# Patient Record
Sex: Female | Born: 1970 | Race: Black or African American | Hispanic: No | Smoking: Never smoker
Health system: Southern US, Community
[De-identification: ages and names within clinical notes are randomized; demographics above are authoritative.]

## PROBLEM LIST (undated history)

## (undated) DIAGNOSIS — E669 Obesity, unspecified: Secondary | ICD-10-CM

## (undated) DIAGNOSIS — G4730 Sleep apnea, unspecified: Secondary | ICD-10-CM

## (undated) DIAGNOSIS — I10 Essential (primary) hypertension: Secondary | ICD-10-CM

## (undated) HISTORY — PX: CHOLECYSTECTOMY: SHX55

## (undated) HISTORY — DX: Obesity, unspecified: E66.9

## (undated) HISTORY — PX: ENDOMETRIAL ABLATION: SHX621

## (undated) HISTORY — PX: BREAST BIOPSY: SHX20

## (undated) HISTORY — DX: Sleep apnea, unspecified: G47.30

## (undated) HISTORY — DX: Essential (primary) hypertension: I10

---

## 1998-08-02 ENCOUNTER — Other Ambulatory Visit: Admission: RE | Admit: 1998-08-02 | Discharge: 1998-08-02 | Payer: Self-pay | Admitting: *Deleted

## 1998-10-10 ENCOUNTER — Other Ambulatory Visit: Admission: RE | Admit: 1998-10-10 | Discharge: 1998-10-10 | Payer: Self-pay | Admitting: *Deleted

## 2001-08-27 ENCOUNTER — Emergency Department (HOSPITAL_COMMUNITY): Admission: EM | Admit: 2001-08-27 | Discharge: 2001-08-28 | Payer: Self-pay | Admitting: Emergency Medicine

## 2002-02-01 ENCOUNTER — Emergency Department (HOSPITAL_COMMUNITY): Admission: EM | Admit: 2002-02-01 | Discharge: 2002-02-01 | Payer: Self-pay | Admitting: Emergency Medicine

## 2002-02-01 ENCOUNTER — Encounter: Payer: Self-pay | Admitting: Emergency Medicine

## 2003-04-19 ENCOUNTER — Emergency Department (HOSPITAL_COMMUNITY): Admission: EM | Admit: 2003-04-19 | Discharge: 2003-04-19 | Payer: Self-pay | Admitting: Emergency Medicine

## 2003-05-19 ENCOUNTER — Emergency Department (HOSPITAL_COMMUNITY): Admission: EM | Admit: 2003-05-19 | Discharge: 2003-05-19 | Payer: Self-pay | Admitting: Emergency Medicine

## 2003-09-14 ENCOUNTER — Other Ambulatory Visit: Admission: RE | Admit: 2003-09-14 | Discharge: 2003-09-14 | Payer: Self-pay | Admitting: Obstetrics and Gynecology

## 2003-10-18 ENCOUNTER — Other Ambulatory Visit: Admission: RE | Admit: 2003-10-18 | Discharge: 2003-10-18 | Payer: Self-pay | Admitting: Obstetrics and Gynecology

## 2004-09-16 ENCOUNTER — Other Ambulatory Visit: Admission: RE | Admit: 2004-09-16 | Discharge: 2004-09-16 | Payer: Self-pay | Admitting: Obstetrics and Gynecology

## 2005-01-20 ENCOUNTER — Emergency Department (HOSPITAL_COMMUNITY): Admission: EM | Admit: 2005-01-20 | Discharge: 2005-01-20 | Payer: Self-pay | Admitting: Emergency Medicine

## 2005-03-07 ENCOUNTER — Emergency Department (HOSPITAL_COMMUNITY): Admission: EM | Admit: 2005-03-07 | Discharge: 2005-03-07 | Payer: Self-pay | Admitting: Family Medicine

## 2005-12-28 ENCOUNTER — Emergency Department (HOSPITAL_COMMUNITY): Admission: EM | Admit: 2005-12-28 | Discharge: 2005-12-28 | Payer: Self-pay | Admitting: Family Medicine

## 2006-01-29 ENCOUNTER — Encounter: Admission: RE | Admit: 2006-01-29 | Discharge: 2006-01-29 | Payer: Self-pay | Admitting: Cardiology

## 2006-05-05 ENCOUNTER — Other Ambulatory Visit: Admission: RE | Admit: 2006-05-05 | Discharge: 2006-05-05 | Payer: Self-pay | Admitting: Obstetrics and Gynecology

## 2006-06-26 ENCOUNTER — Emergency Department (HOSPITAL_COMMUNITY): Admission: EM | Admit: 2006-06-26 | Discharge: 2006-06-26 | Payer: Self-pay | Admitting: Emergency Medicine

## 2006-06-27 ENCOUNTER — Encounter: Payer: Self-pay | Admitting: Vascular Surgery

## 2006-06-27 ENCOUNTER — Ambulatory Visit (HOSPITAL_COMMUNITY): Admission: RE | Admit: 2006-06-27 | Discharge: 2006-06-27 | Payer: Self-pay | Admitting: Emergency Medicine

## 2006-07-13 ENCOUNTER — Encounter (INDEPENDENT_AMBULATORY_CARE_PROVIDER_SITE_OTHER): Payer: Self-pay | Admitting: *Deleted

## 2006-07-13 ENCOUNTER — Ambulatory Visit (HOSPITAL_COMMUNITY): Admission: RE | Admit: 2006-07-13 | Discharge: 2006-07-13 | Payer: Self-pay | Admitting: Obstetrics and Gynecology

## 2006-08-11 ENCOUNTER — Encounter: Admission: RE | Admit: 2006-08-11 | Discharge: 2006-08-11 | Payer: Self-pay | Admitting: Obstetrics and Gynecology

## 2007-04-14 ENCOUNTER — Emergency Department (HOSPITAL_COMMUNITY): Admission: EM | Admit: 2007-04-14 | Discharge: 2007-04-14 | Payer: Self-pay | Admitting: Emergency Medicine

## 2007-07-26 IMAGING — MG MM SCREEN MAMMOGRAM BILATERAL
4 series · 4 of 4 positions shown · non-contrast
Comparison: none

DG SCREEN MAMMOGRAM BILATERAL
Bilateral CC and MLO view(s) were taken.

DIGITAL SCREENING MAMMOGRAM WITH CAD:
There is a fibroglandular pattern.  No masses or malignant type calcifications are identified.

[R CC]
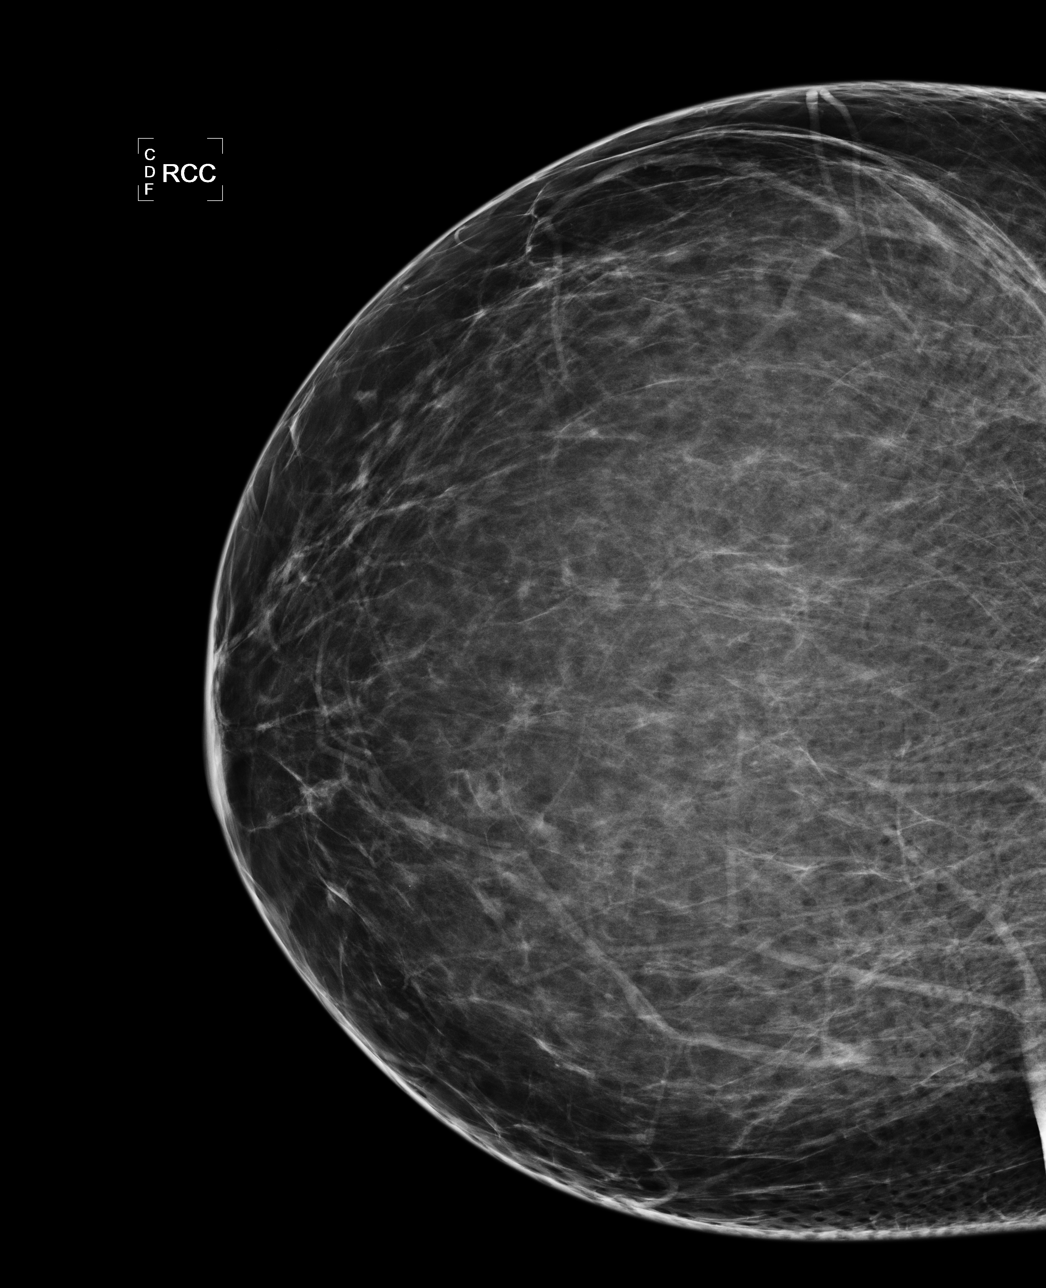

[L CC]
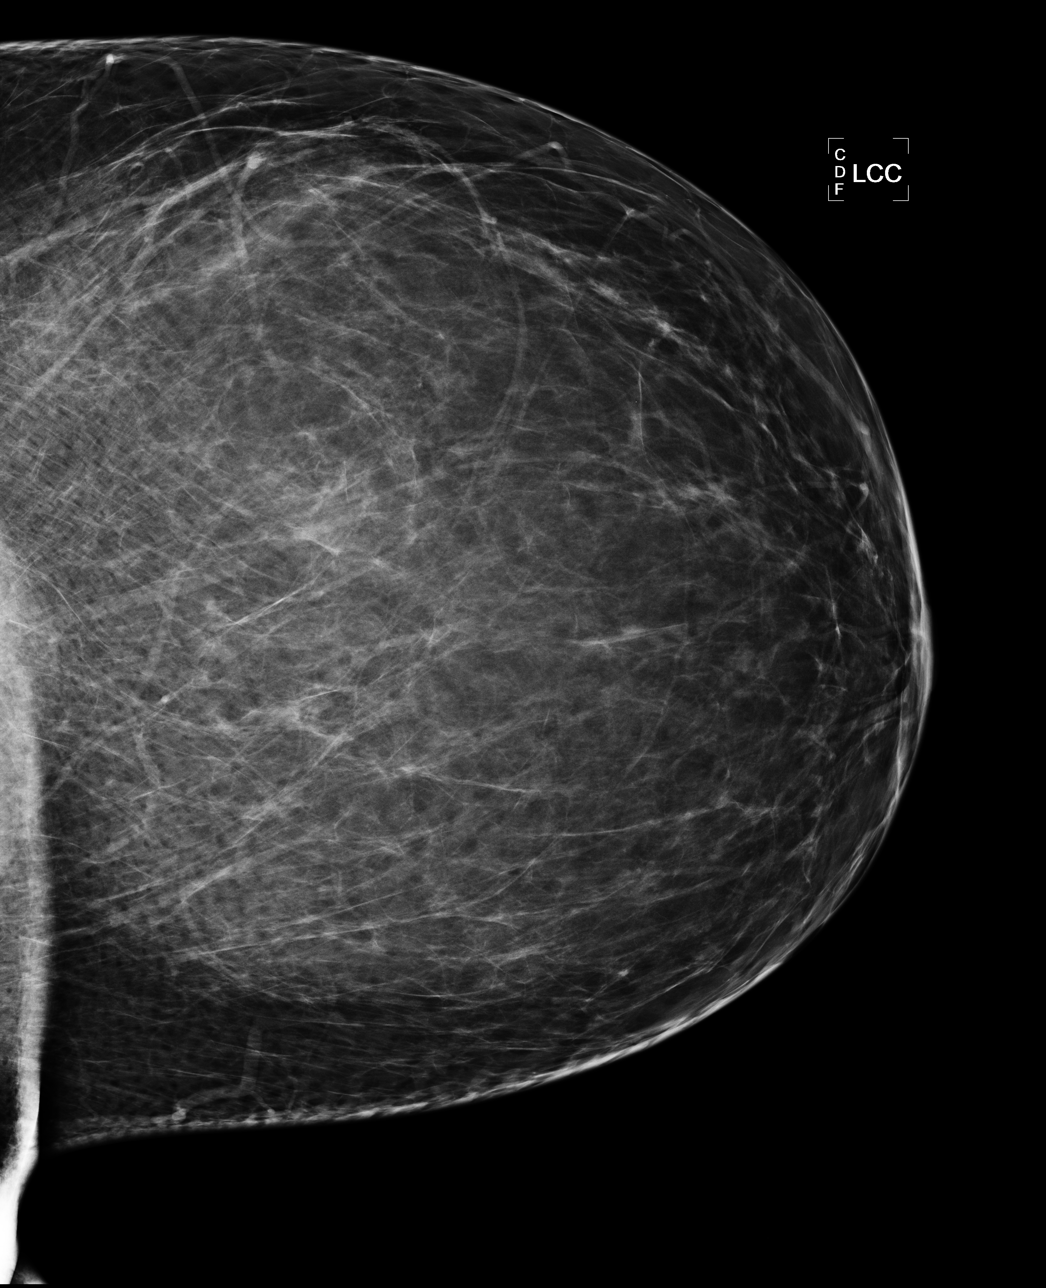

[L MLO]
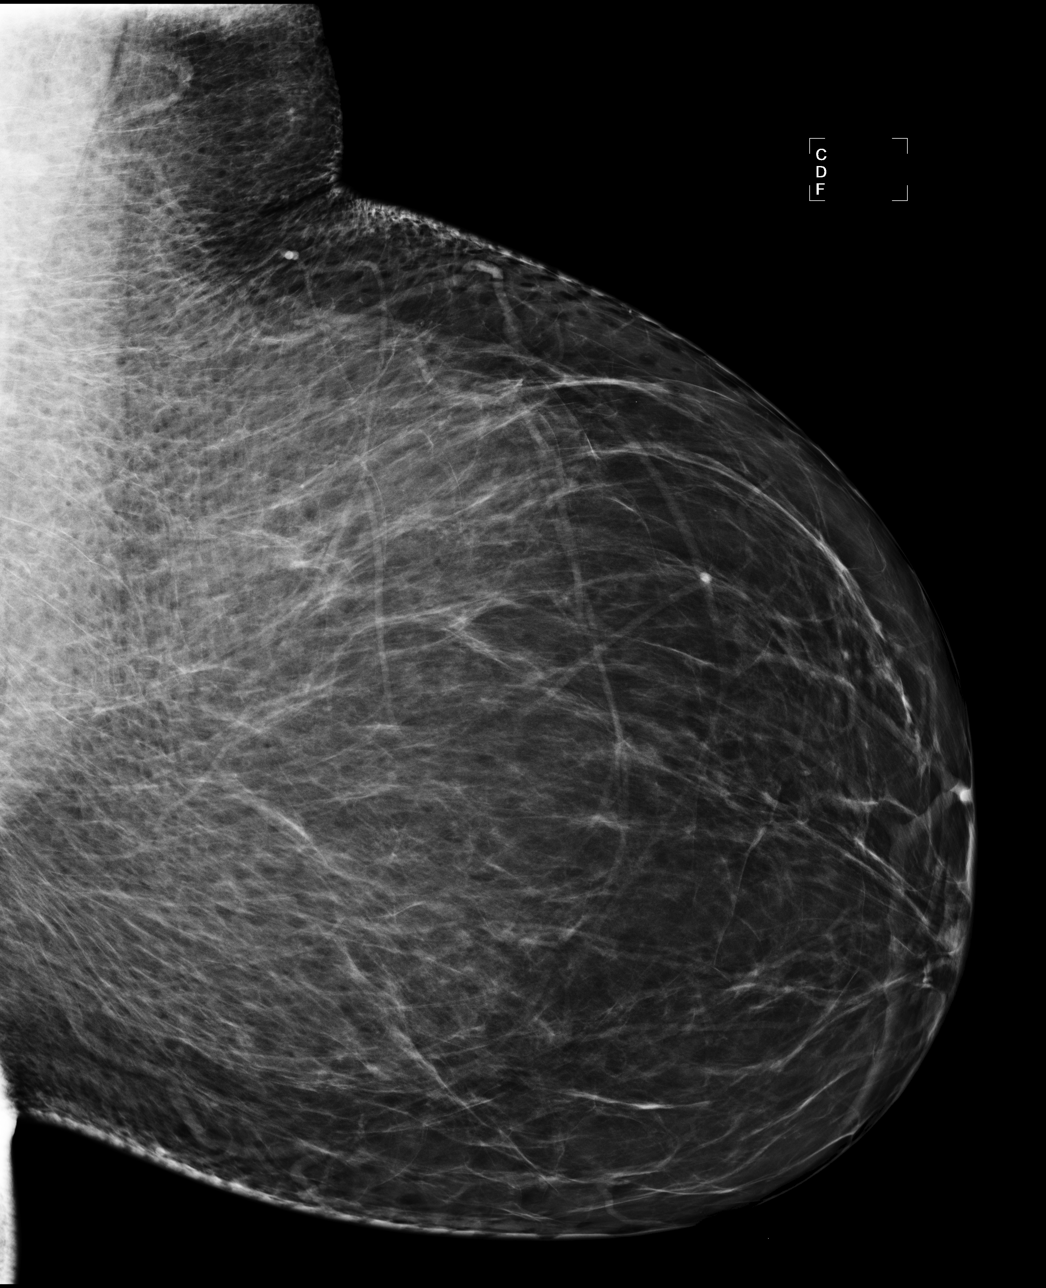

[R MLO]
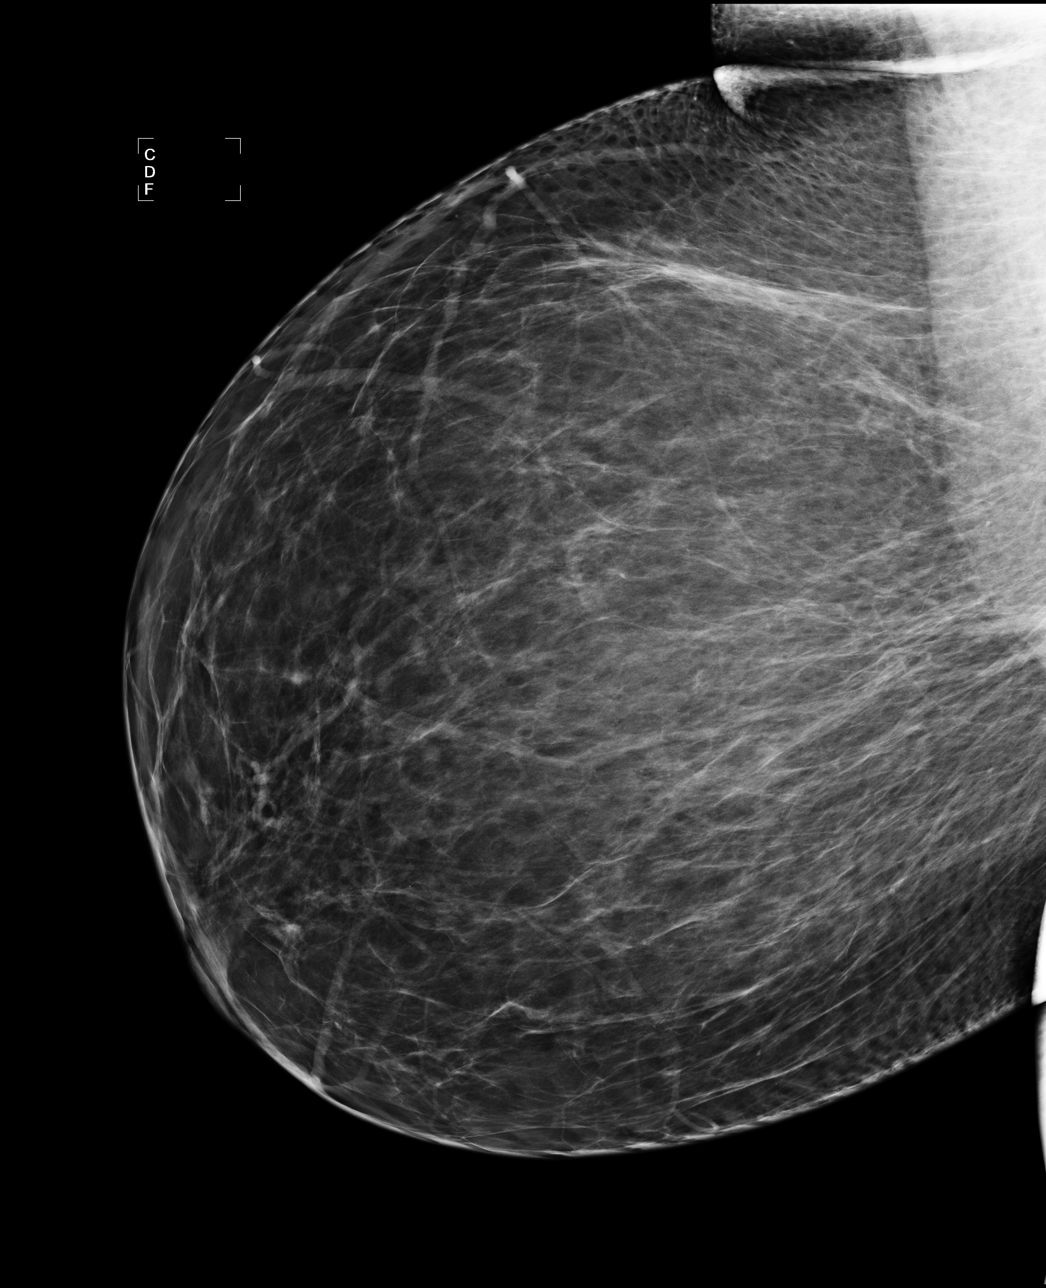

[4 of 4 positions shown; findings below may reference images not displayed]

IMPRESSION: No specific mammographic evidence of malignancy.  Next screening mammogram is recommended in one 
year. Consider screening MRI every 1-2 years given the patient's high risk for breast cancer.

ASSESSMENT: Negative - BI-RADS 1

Screening mammogram in 1 year.
ANALYZED BY COMPUTER AIDED DETECTION. , THIS PROCEDURE WAS A DIGITAL MAMMOGRAM.

## 2007-08-23 ENCOUNTER — Encounter: Payer: Self-pay | Admitting: Family Medicine

## 2007-09-23 ENCOUNTER — Encounter: Admission: RE | Admit: 2007-09-23 | Discharge: 2007-09-23 | Payer: Self-pay | Admitting: Obstetrics and Gynecology

## 2007-10-04 ENCOUNTER — Encounter: Payer: Self-pay | Admitting: Family Medicine

## 2008-10-07 ENCOUNTER — Emergency Department (HOSPITAL_COMMUNITY): Admission: EM | Admit: 2008-10-07 | Discharge: 2008-10-07 | Payer: Self-pay | Admitting: Family Medicine

## 2008-10-12 ENCOUNTER — Encounter: Admission: RE | Admit: 2008-10-12 | Discharge: 2008-10-12 | Payer: Self-pay | Admitting: Obstetrics and Gynecology

## 2009-08-07 ENCOUNTER — Encounter: Payer: Self-pay | Admitting: Family Medicine

## 2009-08-09 ENCOUNTER — Encounter (INDEPENDENT_AMBULATORY_CARE_PROVIDER_SITE_OTHER): Payer: Self-pay | Admitting: *Deleted

## 2009-08-22 ENCOUNTER — Ambulatory Visit: Payer: Self-pay | Admitting: Family Medicine

## 2009-08-22 DIAGNOSIS — I10 Essential (primary) hypertension: Secondary | ICD-10-CM | POA: Insufficient documentation

## 2009-08-22 DIAGNOSIS — G43009 Migraine without aura, not intractable, without status migrainosus: Secondary | ICD-10-CM | POA: Insufficient documentation

## 2009-08-29 ENCOUNTER — Telehealth: Payer: Self-pay | Admitting: Family Medicine

## 2009-09-13 ENCOUNTER — Ambulatory Visit: Payer: Self-pay | Admitting: Family Medicine

## 2009-11-08 ENCOUNTER — Emergency Department (HOSPITAL_COMMUNITY): Admission: EM | Admit: 2009-11-08 | Discharge: 2009-11-08 | Payer: Self-pay | Admitting: Emergency Medicine

## 2010-02-06 ENCOUNTER — Ambulatory Visit: Payer: Self-pay | Admitting: Family Medicine

## 2010-02-18 ENCOUNTER — Telehealth (INDEPENDENT_AMBULATORY_CARE_PROVIDER_SITE_OTHER): Payer: Self-pay | Admitting: *Deleted

## 2010-02-18 LAB — CONVERTED CEMR LAB
AST: 22 units/L (ref 0–37)
Albumin: 3.8 g/dL (ref 3.5–5.2)
CO2: 31 meq/L (ref 19–32)
Calcium: 9.3 mg/dL (ref 8.4–10.5)
Chloride: 104 meq/L (ref 96–112)
Creatinine, Ser: 0.8 mg/dL (ref 0.4–1.2)
Glucose, Bld: 93 mg/dL (ref 70–99)
HDL: 48.1 mg/dL (ref >39.00)
Hgb A1c MFr Bld: 6.2 % (ref 4.6–6.5)
LDL Cholesterol: 82 mg/dL (ref 0–99)
Sodium: 140 meq/L (ref 135–145)
Total Bilirubin: 0.7 mg/dL (ref 0.3–1.2)
Total CHOL/HDL Ratio: 3

## 2010-03-12 ENCOUNTER — Ambulatory Visit: Payer: Self-pay | Admitting: Family Medicine

## 2010-03-12 DIAGNOSIS — J309 Allergic rhinitis, unspecified: Secondary | ICD-10-CM | POA: Insufficient documentation

## 2010-03-12 DIAGNOSIS — E876 Hypokalemia: Secondary | ICD-10-CM | POA: Insufficient documentation

## 2010-04-12 ENCOUNTER — Telehealth: Payer: Self-pay | Admitting: Family Medicine

## 2010-05-03 ENCOUNTER — Telehealth (INDEPENDENT_AMBULATORY_CARE_PROVIDER_SITE_OTHER): Payer: Self-pay | Admitting: *Deleted

## 2010-05-10 ENCOUNTER — Encounter: Payer: Self-pay | Admitting: Family Medicine

## 2010-05-28 ENCOUNTER — Telehealth: Payer: Self-pay | Admitting: Internal Medicine

## 2010-06-10 ENCOUNTER — Ambulatory Visit: Payer: Self-pay | Admitting: Family Medicine

## 2010-06-13 ENCOUNTER — Telehealth: Payer: Self-pay | Admitting: Family Medicine

## 2010-07-09 ENCOUNTER — Ambulatory Visit: Payer: Self-pay | Admitting: Family Medicine

## 2010-07-09 DIAGNOSIS — G43909 Migraine, unspecified, not intractable, without status migrainosus: Secondary | ICD-10-CM | POA: Insufficient documentation

## 2010-07-29 ENCOUNTER — Ambulatory Visit: Payer: Self-pay | Admitting: Family Medicine

## 2010-07-31 LAB — CONVERTED CEMR LAB: Sed Rate: 15 mm/hr (ref 0–22)

## 2010-08-19 ENCOUNTER — Ambulatory Visit: Payer: Self-pay | Admitting: Family Medicine

## 2010-08-19 DIAGNOSIS — J069 Acute upper respiratory infection, unspecified: Secondary | ICD-10-CM | POA: Insufficient documentation

## 2010-10-07 ENCOUNTER — Telehealth: Payer: Self-pay | Admitting: Family Medicine

## 2010-11-18 ENCOUNTER — Ambulatory Visit: Payer: Self-pay | Admitting: Family Medicine

## 2010-11-18 DIAGNOSIS — R7309 Other abnormal glucose: Secondary | ICD-10-CM | POA: Insufficient documentation

## 2010-11-18 HISTORY — DX: Other abnormal glucose: R73.09

## 2010-11-26 LAB — CONVERTED CEMR LAB
CO2: 25 meq/L (ref 19–32)
Calcium: 9.1 mg/dL (ref 8.4–10.5)
Chloride: 105 meq/L (ref 96–112)
Glucose, Bld: 88 mg/dL (ref 70–99)
Potassium: 4 meq/L (ref 3.5–5.1)
Sodium: 137 meq/L (ref 135–145)

## 2010-11-27 ENCOUNTER — Encounter: Payer: Self-pay | Admitting: Family Medicine

## 2010-12-21 ENCOUNTER — Encounter: Payer: Self-pay | Admitting: Obstetrics and Gynecology

## 2010-12-22 ENCOUNTER — Encounter: Payer: Self-pay | Admitting: Neurology

## 2010-12-29 LAB — CONVERTED CEMR LAB
ALT: 18 units/L (ref 0–35)
AST: 22 units/L (ref 0–37)
BUN: 9 mg/dL (ref 6–23)
Basophils Absolute: 0 10*3/uL (ref 0.0–0.1)
Bilirubin, Direct: 0 mg/dL (ref 0.0–0.3)
CO2: 32 meq/L (ref 19–32)
Calcium: 9.4 mg/dL (ref 8.4–10.5)
Chloride: 103 meq/L (ref 96–112)
Cholesterol: 156 mg/dL (ref 0–200)
Creatinine, Ser: 0.8 mg/dL (ref 0.4–1.2)
Creatinine, Ser: 1 mg/dL (ref 0.4–1.2)
Eosinophils Relative: 5.1 % — ABNORMAL HIGH (ref 0.0–5.0)
GFR calc non Af Amer: 80.01 mL/min (ref >60)
HDL: 35.5 mg/dL — ABNORMAL LOW (ref >39.00)
LDL Cholesterol: 109 mg/dL — ABNORMAL HIGH (ref 0–99)
Monocytes Relative: 11.6 % (ref 3.0–12.0)
Neutrophils Relative %: 31.4 % — ABNORMAL LOW (ref 43.0–77.0)
Platelets: 257 10*3/uL (ref 150.0–400.0)
Potassium: 4.1 meq/L (ref 3.5–5.1)
Total Bilirubin: 0.9 mg/dL (ref 0.3–1.2)
Triglycerides: 60 mg/dL (ref 0.0–149.0)
VLDL: 12 mg/dL (ref 0.0–40.0)
WBC: 5.9 10*3/uL (ref 4.5–10.5)

## 2010-12-31 NOTE — Assessment & Plan Note (Signed)
Summary: roa/cbs   Vital Signs:  Patient profile:   40 year old female Weight:      250 pounds Temp:     98.5 degrees F oral Pulse rate:   60 / minute Pulse rhythm:   regular BP sitting:   120 / 92  (right arm)  Vitals Entered By: Almeta Monas CMA Duncan Dull) (July 29, 2010 3:27 PM)  Serial Vital Signs/Assessments:  Time      Position  BP       Pulse  Resp  Temp     By 3:41 PM             140/98                         Loreen Freud DO  CC: f/u still having headachdes   History of Present Illness:  Hypertension follow-up      This is a 40 year old woman who presents for Hypertension follow-up.  The patient complains of headaches, but denies lightheadedness, urinary frequency, edema, impotence, rash, and fatigue.  The patient denies the following associated symptoms: chest pain, chest pressure, exercise intolerance, dyspnea, palpitations, syncope, leg edema, and pedal edema.  Compliance with medications (by patient report) has been near 100%.  The patient reports that dietary compliance has been good.  Adjunctive measures currently used by the patient include salt restriction.    Current Medications (verified): 1)  Diovan 80 Mg Tabs (Valsartan) .Marland Kitchen.. 1 By Mouth Once Daily 2)  Nasonex 50 Mcg/act Susp (Mometasone Furoate) 3)  Imitrex 50 Mg Tabs (Sumatriptan Succinate) .... As Directed 4)  Topamax 100 Mg Tabs (Topiramate) .Marland Kitchen.. 1 By Mouth Two Times A Day  Allergies (verified): No Known Drug Allergies  Physical Exam  General:  Well-developed,well-nourished,in no acute distress; alert,appropriate and cooperative throughout examination Eyes:  pupils equal, pupils round, pupils reactive to light, and no injection.   Lungs:  Normal respiratory effort, chest expands symmetrically. Lungs are clear to auscultation, no crackles or wheezes. Heart:  normal rate and no murmur.   Extremities:  No clubbing, cyanosis, edema, or deformity noted with normal full range of motion of all joints.     Neurologic:  alert & oriented X3, cranial nerves II-XII intact, strength normal in all extremities, and gait normal.   Skin:  Intact without suspicious lesions or rashes Psych:  Cognition and judgment appear intact. Alert and cooperative with normal attention span and concentration. No apparent delusions, illusions, hallucinations   Impression & Recommendations:  Problem # 1:  HYPERTENSION (ICD-401.9)  Her updated medication list for this problem includes:    Diovan 160 Mg Tabs (Valsartan) .Marland Kitchen... 1 by mouth once daily  BP today: 120/92 Prior BP: 130/100 (07/09/2010)  Labs Reviewed: K+: 4.1 (03/12/2010) Creat: : 0.8 (03/12/2010)   Chol: 142 (02/06/2010)   HDL: 48.10 (02/06/2010)   LDL: 82 (02/06/2010)   TG: 61.0 (02/06/2010)  Problem # 2:  MIGRAINE HEADACHE (ICD-346.90) Assessment: Unchanged  Her updated medication list for this problem includes:    Imitrex 50 Mg Tabs (Sumatriptan succinate) .Marland Kitchen... As directed    Vicodin Es 7.5-750 Mg Tabs (Hydrocodone-acetaminophen) .Marland Kitchen... 1 by mouth q6h as needed  Orders: Venipuncture (04540) TLB-Sedimentation Rate (ESR) (85652-ESR)  Complete Medication List: 1)  Diovan 160 Mg Tabs (Valsartan) .Marland Kitchen.. 1 by mouth once daily 2)  Nasonex 50 Mcg/act Susp (Mometasone furoate) 3)  Imitrex 50 Mg Tabs (Sumatriptan succinate) .... As directed 4)  Topamax 100 Mg Tabs (Topiramate) .Marland KitchenMarland KitchenMarland Kitchen  1 by mouth two times a day 5)  Vicodin Es 7.5-750 Mg Tabs (Hydrocodone-acetaminophen) .Marland Kitchen.. 1 by mouth q6h as needed  Patient Instructions: 1)  Please schedule a follow-up appointment in 2 weeks.  Prescriptions: VICODIN ES 7.5-750 MG TABS (HYDROCODONE-ACETAMINOPHEN) 1 by mouth q6h as needed  #10 x 0   Entered and Authorized by:   Loreen Freud DO   Signed by:   Loreen Freud DO on 07/29/2010   Method used:   Print then Give to Patient   RxID:   [id number]

## 2010-12-31 NOTE — Assessment & Plan Note (Signed)
Summary: F/U on med/scm   Vital Signs:  Patient profile:   40 year old female Height:      63 inches Weight:      250 pounds BMI:     44.45 Temp:     98.6 degrees F oral Pulse rate:   95 / minute BP sitting:   86 / 58  (left arm)  Vitals Entered By: Jeremy Johann CMA (June 10, 2010 4:20 PM) CC: f/u med Comments REVIEWED MED LIST, PATIENT AGREED DOSE AND INSTRUCTION CORRECT    History of Present Illness: Pt here to f/u weight.  Pt is walking everyday.   She is doing weight watchers.  She has not been in since April.  No complaints.  Current Medications (verified): 1)  Phentermine Hcl 37.5 Mg Caps (Phentermine Hcl) .Marland Kitchen.. 1 By Mouth Once Daily. 2)  Diovan 40 Mg Tabs (Valsartan) .Marland Kitchen.. 1 By Mouth Once Daily 3)  Klor-Con M20 20 Meq Cr-Tabs (Potassium Chloride Crys Cr) .Marland Kitchen.. 1 By Mouth Daily. 4)  Xyzal 5 Mg Tabs (Levocetirizine Dihydrochloride) .Marland Kitchen.. 1 By Mouth Once Daily 5)  Nasonex 50 Mcg/act Susp (Mometasone Furoate) 6)  Astepro 0.15 % Soln (Azelastine Hcl)  Allergies (verified): No Known Drug Allergies  Past History:  Past medical, surgical, family and social histories (including risk factors) reviewed for relevance to current acute and chronic problems.  Past Medical History: Reviewed history from 03/12/2010 and no changes required. migraines.  Hypertension Current Problems:  MORBID OBESITY (ICD-278.01) COMMON MIGRAINE (ICD-346.10) HYPERTENSION (ICD-401.9) FAMILY HISTORY BREAST CANCER 1ST DEGREE RELATIVE <50 (ICD-V16.3)  Past Surgical History: Reviewed history from 08/22/2009 and no changes required. Caesarean section Cholecystectomy  Family History: Reviewed history from 08/22/2009 and no changes required. Family History Breast cancer 1st degree relative <50 Family History Hypertension Family History Kidney disease--- B---dialysis-- passed away 39 yo  Social History: Reviewed history from 08/22/2009 and no changes required. 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 Lungs:  Normal respiratory effort, chest expands symmetrically. Lungs are clear to auscultation, no crackles or wheezes. Heart:  Normal rate and regular rhythm. S1 and S2 normal without gallop, murmur, click, rub or other extra sounds. Extremities:  No clubbing, cyanosis, edema, or deformity noted with normal full range of motion of all joints.   Skin:  Intact without suspicious lesions or rashes Psych:  Cognition and judgment appear intact. Alert and cooperative with normal attention span and concentration. No apparent delusions, illusions, hallucinations   Impression & Recommendations:  Problem # 1:  MORBID OBESITY (ICD-278.01) con't diet and exercise adipex 37.51  1 by mouth once daily  Ht: 63 (06/10/2010)   Wt: 250 (06/10/2010)   BMI: 44.45 (06/10/2010)  Problem # 2:  HYPERTENSION (ICD-401.9)  low today--decrease diovan recheck 1 month Her updated medication list for this problem includes:    Diovan 40 Mg Tabs (Valsartan) .Marland Kitchen... 1 by mouth once daily  BP today: 86/58 Prior BP: 118/82 (03/12/2010)  Labs Reviewed: K+: 4.1 (03/12/2010) Creat: : 0.8 (03/12/2010)   Chol: 142 (02/06/2010)   HDL: 48.10 (02/06/2010)   LDL: 82 (02/06/2010)   TG: 61.0 (02/06/2010)  Complete Medication List: 1)  Phentermine Hcl 37.5 Mg Caps (Phentermine hcl) .Marland Kitchen.. 1 by mouth once daily. 2)  Diovan 40 Mg Tabs (Valsartan) .Marland Kitchen.. 1 by mouth once daily  3)  Klor-con M20 20 Meq Cr-tabs (Potassium chloride crys cr) .Marland Kitchen.. 1 by mouth daily. 4)  Xyzal 5 Mg Tabs (Levocetirizine dihydrochloride) .Marland Kitchen.. 1 by mouth once daily 5)  Nasonex 50 Mcg/act Susp (Mometasone furoate) 6)  Astepro 0.15 % Soln (Azelastine hcl)  Patient Instructions: 1)  Please schedule a  follow-up appointment in 1 month.  Prescriptions: DIOVAN 40 MG TABS (VALSARTAN) 1 by mouth once daily  #90 x 3   Entered and Authorized by:   Loreen Freud DO   Signed by:   Loreen Freud DO on 06/10/2010   Method used:   Print then Give to Patient   RxID:   [id number] PHENTERMINE HCL 37.5 MG CAPS (PHENTERMINE HCL) 1 by mouth once daily.  #30 x 0   Entered and Authorized by:   Loreen Freud DO   Signed by:   Loreen Freud DO on 06/10/2010   Method used:   Print then Give to Patient   RxID:   [id number]

## 2010-12-31 NOTE — Progress Notes (Signed)
Summary: Prior Auth- approved   Phone Note Refill Request Call back at [phone] Message from:  Pharmacy on May 03, 2010 8:43 AM  Refills Requested: Medication #1:  XYZAL 5 MG TABS 1 by mouth once daily   Dosage confirmed as above?Dosage Confirmed   Supply Requested: 1 month [facility] on [facility] Prior Berkley Harvey [phone]  Next Appointment Scheduled: none Initial call taken by: Harold Barban,  May 03, 2010 8:43 AM  Follow-up for Phone Call        Maura Crandall started- case # [id number]  Additional Follow-up for Phone Call Additional follow up Details #1::        Callled pt to see if she has every tried any OTC, claritin, allegra, zyrtec. Unable to leave voicemail. Army Fossa CMA  May 03, 2010 2:58 PM Left msg for pt to call .Kandice Hams  May 07, 2010 8:42 AM LEFT 3RD MSG FOR PT.Kandice Hams  May 08, 2010 2:37 PM PT HAS TRIED ALLEGRA AND ZYRTEC per 03/12/10 office note .Kandice Hams  May 10, 2010 10:34 AM     Additional Follow-up by: Kandice Hams,  May 10, 2010 10:34 AM    Additional Follow-up for Phone Call Additional follow up Details #2::    xyzal has been approved from 04/19/10- 05/10/11 information faxed to pharmacy.......Marland KitchenDoristine Devoid  May 13, 2010 9:46 AM

## 2010-12-31 NOTE — Assessment & Plan Note (Signed)
Summary: roa per pt//lch   Vital Signs:  Patient profile:   40 year old female Weight:      255 pounds Pulse rate:   68 / minute Pulse rhythm:   regular BP sitting:   118 / 82  (left arm) Cuff size:   regular  Vitals Entered By: Army Fossa CMA (March 12, 2010 10:27 AM) CC: Pt here for refill on Phentermine, discuss allergies, URI symptoms   History of Present Illness:       This is a 40 year old woman who presents with URI symptoms.  The patient complains of clear nasal discharge, but denies nasal congestion, purulent nasal discharge, sore throat, dry cough, productive cough, earache, and sick contacts.  The patient denies fever, low-grade fever (<100.5 degrees), fever of 100.5-103 degrees, fever of 103.1-104 degrees, fever to >104 degrees, stiff neck, dyspnea, wheezing, rash, vomiting, diarrhea, use of an antipyretic, and response to antipyretic.  The patient also reports itchy watery eyes, sneezing, and seasonal symptoms.  The patient denies response to antihistamine, headache, muscle aches, and severe fatigue.  The patient denies the following risk factors for Strep sinusitis: unilateral facial pain, unilateral nasal discharge, poor response to decongestant, double sickening, tooth pain, Strep exposure, tender adenopathy, and absence of cough.  Pt tried zyrtec and allegra with no relief.    Pt also here for refill phenteramine.  Pt using [facility] on the [place].  Pt is doing weight watchers online.  Pt eating mostly fruit.    Allergies (verified): No Known Drug Allergies  Past History:  Past Medical History: migraines.  Hypertension Current Problems:  MORBID OBESITY (ICD-278.01) COMMON MIGRAINE (ICD-346.10) HYPERTENSION (ICD-401.9) FAMILY HISTORY BREAST CANCER 1ST DEGREE RELATIVE <50 (ICD-V16.3)  Physical Exam  General:  Well-developed,well-nourished,in no acute distress; alert,appropriate and cooperative throughout examination Ears:  External ear exam shows no  significant lesions or deformities.  Otoscopic examination reveals clear canals, tympanic membranes are intact bilaterally without bulging, retraction, inflammation or discharge. Hearing is grossly normal bilaterally. Nose:  no external deformity, mucosal erythema, and mucosal edema.   Mouth:  Oral mucosa and oropharynx without lesions or exudates.  Teeth in good repair. Neck:  No deformities, masses, or tenderness noted. Lungs:  Normal respiratory effort, chest expands symmetrically. Lungs are clear to auscultation, no crackles or wheezes. Heart:  normal rate and no murmur.   Extremities:  No clubbing, cyanosis, edema, or deformity noted with normal full range of motion of all joints.   Skin:  Intact without suspicious lesions or rashes Cervical Nodes:  No lymphadenopathy noted Psych:  Oriented X3 and normally interactive.     Impression & Recommendations:  Problem # 1:  MORBID OBESITY (ICD-278.01)  d/w importance of diet and exerices refill phenteramine rto 1 month Ht: 63 (08/22/2009)   Wt: 255 (03/12/2010)   BMI: 45.51 (08/22/2009)  Orders: EKG w/ Interpretation (93000)  Problem # 2:  ALLERGIC RHINITIS (ICD-477.9)  Her updated medication list for this problem includes:    Xyzal 5 Mg Tabs (Levocetirizine dihydrochloride) .Marland Kitchen... 1 by mouth once daily    Nasonex 50 Mcg/act Susp (Mometasone furoate)    Astepro 0.15 % Soln (Azelastine hcl)  Discussed use of allergy medications and environmental measures.   Problem # 3:  HYPOKALEMIA (ICD-276.8)  Orders: Venipuncture (16109) TLB-BMP (Basic Metabolic Panel-BMET) (80048-METABOL)  Complete Medication List: 1)  Phentermine Hcl 37.5 Mg Caps (Phentermine hcl) .Marland Kitchen.. 1 by mouth once daily. 2)  Diovan Hct 80-12.5 Mg Tabs (Valsartan-hydrochlorothiazide) .Marland Kitchen.. 1 by mouth once  daily 3)  Klor-con M20 20 Meq Cr-tabs (Potassium chloride crys cr) .Marland Kitchen.. 1 by mouth daily. 4)  Xyzal 5 Mg Tabs (Levocetirizine dihydrochloride) .Marland Kitchen.. 1 by mouth once  daily 5)  Nasonex 50 Mcg/act Susp (Mometasone furoate) 6)  Astepro 0.15 % Soln (Azelastine hcl) Prescriptions: XYZAL 5 MG TABS (LEVOCETIRIZINE DIHYDROCHLORIDE) 1 by mouth once daily  #30 x 5   Entered and Authorized by:   Loreen Freud DO   Signed by:   Loreen Freud DO on 03/12/2010   Method used:   Print then Give to Patient   RxID:   [id number] PHENTERMINE HCL 37.5 MG CAPS (PHENTERMINE HCL) 1 by mouth once daily.  #30 x 0   Entered and Authorized by:   Loreen Freud DO   Signed by:   Loreen Freud DO on 03/12/2010   Method used:   Print then Give to Patient   RxID:   [id number]    EKG  Procedure date:  03/12/2010  Findings:      Normal sinus rhythm with rate of:  68

## 2010-12-31 NOTE — Progress Notes (Signed)
Summary: Phentermine  Phone Note Call from Patient Call back at Work Phone [phone]   Summary of Call: Patient picked up phentermine yesterday and it was caps not tabs. Patient was inquiring as to why it was changed. Please advise. Initial call taken by: Lucious Groves CMA,  June 13, 2010 11:32 AM  Follow-up for Phone Call        somehow it was changed on med list---they are the same --we can change it back for next time if she prefers.  Follow-up by: Loreen Freud DO,  June 13, 2010 11:43 AM  Additional Follow-up for Phone Call Additional follow up Details #1::        pt aware and prefers tabs instead. med changed back.............[name] CMA  June 13, 2010 11:54 AM     New/Updated Medications: PHENTERMINE HCL 37.5 MG TABS (PHENTERMINE HCL) 1 tab once daily

## 2010-12-31 NOTE — Progress Notes (Signed)
Summary: Triage: Refill Request/ Med Change Request  Phone Note Call from Patient Call back at [phone]   Summary of Call: Message left on triage VM: patient would like a refill on phentermine and would like B/P med changed b/c current med is too expensive  I spoke with patient and informed her to refill Phentermine she needs OV (scheduled for 06/10/2010 @ 4:00pm), patient then mentiond she would like a B/P med that has a generic (on 4 dollar [employer]). Patient would like new med sent to [facility] ([place] Patient aware Dr.Lowne is out of the office and another Dr. will advise  Please advise Shonna Chock  May 28, 2010 10:19 AM   Follow-up for Phone Call        i have 3 weeks of samples available for pt to take.  she can discuss changing meds w/ her regular Dr when she returns for her appt. Follow-up by: Neena Rhymes MD,  May 28, 2010 10:41 AM  Additional Follow-up for Phone Call Additional follow up Details #1::        Patient aware and ok'd Additional Follow-up by: Shonna Chock,  May 28, 2010 11:09 AM

## 2010-12-31 NOTE — Progress Notes (Signed)
Summary: med too expensive  Phone Note Call from Patient Call back at Work Phone [phone]   Caller: Patient Summary of Call: Pt left VM that diovan is too expensive now that her discount card has expired. Pt would like a cheaper alternative.......................[name] CMA  Apr 12, 2010 9:47 AM   pt currently out of med. pt given samples of med until alternative med can be rx..............[name] CMA  Apr 12, 2010 9:49 AM   Follow-up for Phone Call        There is a new discount cared she can have or switch to hyzar 50/12.5 #30  1 by mouth once daily -----ov 2-3 weeks Follow-up by: Loreen Freud DO,  Apr 13, 2010 6:52 AM  Additional Follow-up for Phone Call Additional follow up Details #1::        Pt is going to use the new Discount card, request that it is mailed to her. Army Fossa CMA  Apr 15, 2010 8:41 AM

## 2010-12-31 NOTE — Assessment & Plan Note (Signed)
Summary: rto 3 weeks/cbs   Vital Signs:  Patient profile:   40 year old female Weight:      247.2 pounds Temp:     98.5 degrees F oral Pulse rate:   84 / minute Pulse rhythm:   regular BP sitting:   118 / 86  (right arm)  Vitals Entered By: Almeta Monas CMA Duncan Dull) (August 19, 2010 3:24 PM) CC: 3 WK F/U C/O flu like symptoms since starting new dose of Diovan , URI symptoms   History of Present Illness:  Hypertension follow-up      This is a 40 year old woman who presents for Hypertension follow-up.  The patient denies lightheadedness, urinary frequency, headaches, edema, impotence, rash, and fatigue.  The patient denies the following associated symptoms: chest pain, chest pressure, exercise intolerance, dyspnea, palpitations, syncope, leg edema, and pedal edema.  Compliance with medications (by patient report) has been near 100%.  The patient reports that dietary compliance has been good.  Adjunctive measures currently used by the patient include salt restriction.         The patient also presents with URI symptoms.  The patient complains of nasal congestion and dry cough.  The patient denies fever, low-grade fever (<100.5 degrees), fever of 100.5-103 degrees, fever of 103.1-104 degrees, fever to >104 degrees, stiff neck, dyspnea, wheezing, rash, vomiting, diarrhea, use of an antipyretic, and response to antipyretic.  The patient also reports sneezing.  The patient denies the following risk factors for Strep sinusitis: unilateral facial pain, unilateral nasal discharge, poor response to decongestant, double sickening, tooth pain, Strep exposure, tender adenopathy, and absence of cough.    Current Medications (verified): 1)  Diovan 160 Mg Tabs (Valsartan) .Marland Kitchen.. 1 By Mouth Once Daily 2)  Nasonex 50 Mcg/act Susp (Mometasone Furoate) 3)  Imitrex 50 Mg Tabs (Sumatriptan Succinate) .... As Directed 4)  Topamax 100 Mg Tabs (Topiramate) .Marland Kitchen.. 1 By Mouth Two Times A Day 5)  Vicodin Es 7.5-750 Mg  Tabs (Hydrocodone-Acetaminophen) .Marland Kitchen.. 1 By Mouth Q6h As Needed  Allergies (verified): No Known Drug Allergies  Past History:  Past medical, surgical, family and social histories (including risk factors) reviewed for relevance to current acute and chronic problems.  Past Medical History: Reviewed history from 03/12/2010 and no changes required. migraines.  Hypertension Current Problems:  MORBID OBESITY (ICD-278.01) COMMON MIGRAINE (ICD-346.10) HYPERTENSION (ICD-401.9) FAMILY HISTORY BREAST CANCER 1ST DEGREE RELATIVE <50 (ICD-V16.3)  Past Surgical History: Reviewed history from 08/22/2009 and no changes required. Caesarean section Cholecystectomy  Family History: Reviewed history from 08/22/2009 and no changes required. Family History Breast cancer 1st degree relative <50 Family History Hypertension Family History Kidney disease--- B---dialysis-- passed away 39 yo  Social History: Reviewed history from 08/22/2009 and no changes required. 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 Lungs are clear to auscultation, no crackles or wheezes. Heart:  Normal rate and regular rhythm. S1 and S2 normal without gallop, murmur, click, rub or other extra sounds. Extremities:  No clubbing, cyanosis, edema, or deformity noted with normal full range of motion of all joints.   Psych:  Cognition and judgment appear intact. Alert and cooperative with normal attention span and concentration. No apparent delusions, illusions, hallucinations   Impression & Recommendations:  Problem # 1:  HYPERTENSION (ICD-401.9)  Her updated medication list  for this problem includes:    Diovan 160 Mg Tabs (Valsartan) .Marland Kitchen... 1 by mouth once daily  BP  today: 118/86 Prior BP: 120/92 (07/29/2010)  Labs Reviewed: K+: 4.1 (03/12/2010) Creat: : 0.8 (03/12/2010)   Chol: 142 (02/06/2010)   HDL: 48.10 (02/06/2010)   LDL: 82 (02/06/2010)   TG: 61.0 (02/06/2010)  Problem # 2:  URI (ICD-465.9)  Her updated medication list for this problem includes:    Claritin 10 Mg Tabs (Loratadine) .Marland Kitchen... 1 by mouth once daily flonase  rto as needed   Instructed on symptomatic treatment. Call if symptoms persist or worsen.   Complete Medication List: 1)  Diovan 160 Mg Tabs (Valsartan) .Marland Kitchen.. 1 by mouth once daily 2)  Nasonex 50 Mcg/act Susp (Mometasone furoate) 3)  Imitrex 50 Mg Tabs (Sumatriptan succinate) .... As directed 4)  Topamax 100 Mg Tabs (Topiramate) .Marland Kitchen.. 1 by mouth two times a day 5)  Vicodin Es 7.5-750 Mg Tabs (Hydrocodone-acetaminophen) .Marland Kitchen.. 1 by mouth q6h as needed 6)  Flonase 50 Mcg/act Susp (Fluticasone propionate) .... 2 sprays each nostril once daily 7)  Claritin 10 Mg Tabs (Loratadine) .Marland Kitchen.. 1 by mouth once daily  Patient Instructions: 1)  Please schedule a follow-up appointment in 3 months .  Prescriptions: DIOVAN 160 MG TABS (VALSARTAN) 1 by mouth once daily  #90 x 3   Entered and Authorized by:   Loreen Freud DO   Signed by:   Loreen Freud DO on 08/19/2010   Method used:   Electronically to        [facility] [phone]* (retail)       [address]       [place], [place]  [address]       Ph: [phone]       Fax: [phone]   RxID:   [id number] FLONASE 50 MCG/ACT SUSP (FLUTICASONE PROPIONATE) 2 sprays each nostril once daily  #1 x 1   Entered and Authorized by:   Loreen Freud DO   Signed by:   Loreen Freud DO on 08/19/2010   Method used:   Electronically to        [facility] [phone]* (retail)       [address]       [place], [place]  [address]       Ph: [phone]       Fax: [phone]   RxID:   [id number]

## 2010-12-31 NOTE — Progress Notes (Signed)
Summary: med reaction  Phone Note Refill Request Call back at [phone]   Refills Requested: Medication #1:  DIOVAN 160 MG TABS 1 by mouth once daily Pt states that BP med is causing nausea, vomiting, and hair loss. Pt would like to have med change to something else. Pt uses [facility] [address]..................[name] CMA  October 07, 2010 8:10 AM    Follow-up for Phone Call        norvasc 5 mg #30  1 by mouth once daily ----ov 2-3 weeks Follow-up by: Loreen Freud DO,  October 07, 2010 10:18 AM  Additional Follow-up for Phone Call Additional follow up Details #1::        left pt detail message rx sent to pharmacy and to call for OV in 2-3 weeks....Marland KitchenMarland KitchenFelecia Deloach CMA  October 07, 2010 12:28 PM     New/Updated Medications: NORVASC 5 MG TABS (AMLODIPINE BESYLATE) Take 1 by mouth once daily**OV in 2-3 weeks** Prescriptions: NORVASC 5 MG TABS (AMLODIPINE BESYLATE) Take 1 by mouth once daily**OV in 2-3 weeks**  #30 x 0   Entered by:   Jeremy Johann CMA   Authorized by:   Loreen Freud DO   Signed by:   Jeremy Johann CMA on 10/07/2010   Method used:   Faxed to ...       [facility] [address] [phone]* (retail)       [address]       [place], [place]  [address]       Ph: [phone]       Fax: [phone]   RxID:   [id number]

## 2010-12-31 NOTE — Medication Information (Signed)
Summary: Prior Authorization & Approval for Xyzal/[employer]  Prior Authorization & Approval for Xyzal/[employer]   Imported By: Lanelle Bal 05/18/2010 08:22:22  _____________________________________________________________________  External Attachment:    Type:   Image     Comment:   External Document

## 2010-12-31 NOTE — Assessment & Plan Note (Signed)
Summary: f/u med/cb   Vital Signs:  Patient profile:   40 year old female Height:      63 inches Weight:      251 pounds BMI:     44.62 Temp:     98.2 degrees F oral Pulse rate:   76 / minute BP sitting:   130 / 100  (left arm)  Vitals Entered By: Jeremy Johann CMA (July 09, 2010 3:24 PM) CC: F/U MED, REFILL   History of Present Illness:  Hypertension follow-up      This is a 40 year old woman who presents for Hypertension follow-up.  The patient complains of headaches, but denies lightheadedness, urinary frequency, edema, impotence, rash, and fatigue.  The patient denies the following associated symptoms: chest pain, chest pressure, exercise intolerance, dyspnea, palpitations, syncope, leg edema, and pedal edema.  Compliance with medications (by patient report) has been near 100%.  The patient reports that dietary compliance has been good.  The patient reports no exercise.  Adjunctive measures currently used by the patient include salt restriction.    Current Medications (verified): 1)  Diovan 80 Mg Tabs (Valsartan) .Marland Kitchen.. 1 By Mouth Once Daily 2)  Nasonex 50 Mcg/act Susp (Mometasone Furoate) 3)  Imitrex 50 Mg Tabs (Sumatriptan Succinate) .... As Directed 4)  Topamax 100 Mg Tabs (Topiramate) .Marland Kitchen.. 1 By Mouth Two Times A Day  Allergies (verified): No Known Drug Allergies  Past History:  Past medical, surgical, family and social histories (including risk factors) reviewed for relevance to current acute and chronic problems.  Past Medical History: Reviewed history from 03/12/2010 and no changes required. migraines.  Hypertension Current Problems:  MORBID OBESITY (ICD-278.01) COMMON MIGRAINE (ICD-346.10) HYPERTENSION (ICD-401.9) FAMILY HISTORY BREAST CANCER 1ST DEGREE RELATIVE <50 (ICD-V16.3)  Past Surgical History: Reviewed history from 08/22/2009 and no changes required. Caesarean section Cholecystectomy  Family History: Reviewed history from 08/22/2009 and no  changes required. Family History Breast cancer 1st degree relative <50 Family History Hypertension Family History Kidney disease--- B---dialysis-- passed away 39 yo  Social History: Reviewed history from 08/22/2009 and no changes required. 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 Lungs are clear to auscultation, no crackles or wheezes. Heart:  normal rate and no murmur.   Extremities:  No clubbing, cyanosis, edema, or deformity noted with normal full range of motion of all joints.   Psych:  Cognition and judgment appear intact. Alert and cooperative with normal attention span and concentration. No apparent delusions, illusions, hallucinations   Impression & Recommendations:  Problem # 1:  HYPERTENSION (ICD-401.9)  Her updated medication list for this problem includes:    Diovan 80 Mg Tabs (Valsartan) .Marland Kitchen... 1 by mouth once daily  BP today: 130/100 Prior BP: 86/58 (06/10/2010)  Labs Reviewed: K+: 4.1 (03/12/2010) Creat: : 0.8 (03/12/2010)   Chol: 142 (02/06/2010)   HDL: 48.10 (02/06/2010)   LDL: 82 (02/06/2010)   TG: 61.0 (02/06/2010)  Problem # 2:  COMMON MIGRAINE (ICD-346.10)  Her updated medication list for this problem includes:    Imitrex 50 Mg Tabs (Sumatriptan succinate) .Marland Kitchen... As directed  Headache diary reviewed.  Complete Medication List: 1)  Diovan 80 Mg Tabs (Valsartan) .Marland Kitchen.. 1 by mouth once daily 2)  Nasonex 50 Mcg/act Susp (Mometasone furoate) 3)  Imitrex 50 Mg Tabs (Sumatriptan succinate) .Marland KitchenMarland KitchenMarland Kitchen  As directed 4)  Topamax 100 Mg Tabs (Topiramate) .Marland Kitchen.. 1 by mouth two times a day  Patient Instructions: 1)  Please schedule a follow-up appointment in 2 weeks.  Prescriptions: TOPAMAX 100 MG TABS (TOPIRAMATE) 1  by mouth two times a day  #60 x 5   Entered and Authorized by:   Loreen Freud DO   Signed by:   Loreen Freud DO on 07/09/2010   Method used:   Electronically to        [facility] [phone]* (retail)       [address]       [place], [place]  [address]       Ph: [phone]       Fax: [phone]   RxID:   [id number] IMITREX 50 MG TABS (SUMATRIPTAN SUCCINATE) as directed  #12 x 0   Entered and Authorized by:   Loreen Freud DO   Signed by:   Loreen Freud DO on 07/09/2010   Method used:   Electronically to        [facility] [phone]* (retail)       [address]       [place], [place]  [address]       Ph: [phone]       Fax: [phone]   RxID:   [id number]   Appended Document: Orders Update    Clinical Lists Changes  Problems: Added new problem of MIGRAINE HEADACHE (ICD-346.90) Orders: Added new Service order of Ketorolac-Toradol 15mg  [phone]) - Signed Added new Service order of Admin of Therapeutic Inj  intramuscular or subcutaneous (77824) - Signed       Medication Administration  Injection # 1:    Medication: Ketorolac-Toradol 15mg     Diagnosis: MIGRAINE HEADACHE (ICD-346.90)    Route: IM    Site: RUOQ gluteus    Exp Date: 01/30/2012    Lot #: [id number]    Mfr: novaplus    Comments: pt given 60mg     Patient tolerated injection without complications    Given by: Jeremy Johann CMA (July 09, 2010 5:21 PM)  Orders Added: 1)  Ketorolac-Toradol 15mg  [J1885] 2)  Admin of Therapeutic Inj  intramuscular or subcutaneous [43154]

## 2010-12-31 NOTE — Progress Notes (Signed)
Summary: Lab Results   Phone Note Outgoing Call   Call placed by: Army Fossa CMA,  February 18, 2010 2:11 PM Reason for Call: Discuss lab or test results Summary of Call: Regarding lab results, LMTCB:  K is low----take kcl 20 meq 1 by mouth once daily #30  2 refills also send K rich foods--she needs to pick one off the list daily recheck 2 weeks  bmp  401.9 Signed by Loreen Freud DO on 02/17/2010 at 11:39 AM   Follow-up for Phone Call        Pt is aware, meds called in. Will send pt k rich foods in mail Army Fossa CMA  February 20, 2010 9:23 AM     New/Updated Medications: KLOR-CON M20 20 MEQ CR-TABS (POTASSIUM CHLORIDE CRYS CR) 1 by mouth daily. Prescriptions: KLOR-CON M20 20 MEQ CR-TABS (POTASSIUM CHLORIDE CRYS CR) 1 by mouth daily.  #30 x 2   Entered by:   Army Fossa CMA   Authorized by:   Loreen Freud DO   Signed by:   Army Fossa CMA on 02/20/2010   Method used:   Electronically to        [facility] [phone]* (retail)       [address]       [place], [place]  [address]       Ph: [phone]       Fax: [phone]   RxID:   [id number]

## 2011-01-02 NOTE — Assessment & Plan Note (Signed)
Summary: RTO 3 MONTHS/CBS   Vital Signs:  Patient profile:   40 year old female Weight:      254.4 pounds BMI:     45.23 Pulse rate:   80 / minute Pulse rhythm:   regular BP sitting:   120 / 82  (right arm) Cuff size:   large  Vitals Entered By: Almeta Monas CMA Duncan Dull) (November 18, 2010 3:01 PM) CC: 3 mo bp f/u   History of Present Illness:  Hypertension follow-up      This is a 40 year old woman who presents for Hypertension follow-up.  The patient denies lightheadedness, urinary frequency,  edema, impotence, rash, and fatigue.  The patient denies the following associated symptoms: chest pain, chest pressure, exercise intolerance, dyspnea, palpitations, syncope, leg edema, and pedal edema.  Compliance with medications (by patient report) has been near 100%.  The patient reports that dietary compliance has been good.  The patient reports exercising 3-4X per week.  Adjunctive measures currently used by the patient include salt restriction.    Pt states her glucose was high at gyn office.    Preventive Screening-Counseling & Management  Alcohol-Tobacco     Alcohol drinks/day: 0     Smoking Status: never  Caffeine-Diet-Exercise     Caffeine use/day: 1     Does Patient Exercise: no     Exercise Counseling: to improve exercise regimen  Current Medications (verified): 1)  Norvasc 5 Mg Tabs (Amlodipine Besylate) .... Take 1 By Mouth Once Daily 2)  Nasonex 50 Mcg/act Susp (Mometasone Furoate) 3)  Imitrex 50 Mg Tabs (Sumatriptan Succinate) .... As Directed 4)  Topamax 100 Mg Tabs (Topiramate) .Marland Kitchen.. 1 By Mouth Two Times A Day 5)  Vicodin Es 7.5-750 Mg Tabs (Hydrocodone-Acetaminophen) .Marland Kitchen.. 1 By Mouth Q6h As Needed 6)  Claritin 10 Mg Tabs (Loratadine) .Marland Kitchen.. 1 By Mouth Once Daily 7)  Vitamin D3 50000 Unit Caps (Cholecalciferol) .Marland Kitchen.. 1 By Mouth Qweek  Allergies (verified): No Known Drug Allergies  Past History:  Past Medical History: Last updated: 03/12/2010 migraines.    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 Family History Breast cancer 1st degree relative <50 Family History Hypertension Family History Kidney disease--- B---dialysis-- passed away 39 yo Family History Diabetes 1st degree relative  Social History: Reviewed history from 08/22/2009 and no changes required. 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 Lungs:  Normal respiratory effort, chest expands symmetrically. Lungs are clear to auscultation, no crackles or wheezes. Heart:  normal rate and no murmur.   Extremities:  No clubbing, cyanosis, edema, or deformity noted with normal full range of motion of all joints.   Psych:  Cognition and judgment appear intact. Alert and cooperative  with normal attention span and concentration. No apparent delusions, illusions, hallucinations   Impression & Recommendations:  Problem # 1:  HYPERTENSION  (ICD-401.9)  Her updated medication list for this problem includes:    Norvasc 5 Mg Tabs (Amlodipine besylate) .Marland Kitchen... Take 1 by mouth once daily  BP today: 120/82 Prior BP: 118/86 (08/19/2010)  Labs Reviewed: K+: 4.1 (03/12/2010) Creat: : 0.8 (03/12/2010)   Chol: 142 (02/06/2010)   HDL: 48.10 (02/06/2010)   LDL: 82 (02/06/2010)   TG: 61.0 (02/06/2010)  Problem # 2:  FAMILY HISTORY DIABETES 1ST DEGREE RELATIVE (ICD-V18.0)  Problem # 3:  HYPERGLYCEMIA (ICD-790.29)  Orders: Venipuncture (84696) TLB-BMP (Basic Metabolic Panel-BMET) (80048-METABOL) TLB-A1C / Hgb A1C (Glycohemoglobin) (83036-A1C) Specimen Handling (29528)  Labs Reviewed: Creat: 0.8 (03/12/2010)     Complete Medication List: 1)  Norvasc 5 Mg Tabs (Amlodipine besylate) .... Take 1 by mouth once daily 2)  Nasonex 50 Mcg/act Susp (Mometasone furoate) 3)  Imitrex 50 Mg Tabs (Sumatriptan succinate) .... As directed 4)  Topamax 100 Mg Tabs (Topiramate) .Marland Kitchen.. 1 by mouth two times a day 5)  Vicodin Es 7.5-750 Mg Tabs (Hydrocodone-acetaminophen) .Marland Kitchen.. 1 by mouth q6h as needed 6)  Claritin 10 Mg Tabs (Loratadine) .Marland Kitchen.. 1 by mouth once daily 7)  Vitamin D3 50000 Unit Caps (Cholecalciferol) .Marland Kitchen.. 1 by mouth qweek  Patient Instructions: 1)  Please schedule a follow-up appointment in 3 months .  Prescriptions: NORVASC 5 MG TABS (AMLODIPINE BESYLATE) Take 1 by mouth once daily  #30 x 5   Entered and Authorized by:   Loreen Freud DO   Signed by:   Loreen Freud DO on 11/18/2010   Method used:   Electronically to        [facility] [phone]* (retail)       [address]       [place], [place]  [address]       Ph: [phone]       Fax: [phone]   RxID:   [id number] IMITREX 50 MG TABS (SUMATRIPTAN SUCCINATE) as directed  #12 x 0   Entered and Authorized by:   Loreen Freud DO   Signed by:   Loreen Freud DO on 11/18/2010   Method used:   Electronically to        [facility] [phone]* (retail)       [address]       [place], [place]  [address]       Ph: [phone]       Fax: [phone]   RxID:   [id number]    Orders Added: 1)  Venipuncture [71062] 2)  TLB-BMP (Basic Metabolic Panel-BMET) [80048-METABOL] 3)  TLB-A1C / Hgb A1C (Glycohemoglobin) [83036-A1C] 4)  Est. Patient Level III [69485] 5)  Specimen Handling [99000]

## 2011-01-16 NOTE — Letter (Signed)
[facility]   Imported By: Kassie Mends 01/09/2011 11:21:59  _____________________________________________________________________  External Attachment:    Type:   Image     Comment:   External Document

## 2011-02-06 ENCOUNTER — Encounter: Payer: Self-pay | Admitting: Family Medicine

## 2011-02-18 ENCOUNTER — Ambulatory Visit: Payer: Self-pay | Admitting: Family Medicine

## 2011-02-18 DIAGNOSIS — Z0289 Encounter for other administrative examinations: Secondary | ICD-10-CM

## 2011-04-18 NOTE — Op Note (Signed)
Wanda, [name] NO.:  [id number]   MEDICAL RECORD NO.:  [id number]          PATIENT TYPE:  AMB   LOCATION:  SDC                           FACILITY:  [facility]   PHYSICIAN:  Dois Davenport A. [name], M.D. DATE OF BIRTH:  Jul 27, 1971   DATE OF PROCEDURE:  07/13/2006  DATE OF DISCHARGE:  06/27/2006                                 OPERATIVE REPORT   PREOPERATIVE DIAGNOSIS:  Menorrhagia, anemia, possible endometrial polyp.   POSTOPERATIVE DIAGNOSIS:  Menorrhagia, anemia, possible endometrial polyp.   ANESTHESIA:  General and paracervical block.   PROCEDURE:  Hysteroscopy D&C, endometrial ablation with Novasure and  rollerball.   SURGEON:  Dr. Estanislado Pandy.   ESTIMATED BLOOD LOSS:  Minimal.   PROCEDURE:  After being informed of the planned procedure with possible  complications including bleeding, infection, injury to uterus and intra-  abdominal organs, informed consent is obtained.  The patient is taken to OR  #8 given general anesthesia with laryngeal mask without difficulty.  She is  placed in the lithotomy position, prepped and draped in a sterile fashion  and her bladder is emptied with an in-and-out red rubber catheter.  GYN exam  reveals an anteverted uterus and two normal adnexa.  A weighted speculum was  inserted anterior lip of the cervix was grasped with a tenaculum forceps and  we proceed with a paracervical block using Nesacaine 1% 16 mL in the usual  fashion and this was performed for postoperative pain control.  The uterus  is then sounded at 12 cm and the cervix was sounded at 3.5 cm giving [place] a  cavity length of 8.5 cm.  The cervix was then easily dilated using Hegar  dilator until #27 which allows easy entry of the diagnostic hysteroscope.  With maximum perfusion at 90 mmHg.  It is still difficult to visualize the  endometrial cavity because of the flared cervix.  Despite trying to close  the cervix, we are unable to visualize the whole cavity and the  hysteroscope  was removed for the operative hysteroscope.  Maintaining and LR perfusion at  90 mmHg with the operative scope it is now possible to visualize the  endometrial cavity with the two tubal ostia.  No endometrial polyp is  identified.  We do see a thickened endometrium on the posterior wall but  otherwise everything is normal.  We then removed the hysteroscope proceed  with sharp curettage of the endometrial cavity which removes a moderate  amount of normal-appearing and endometrium which is sent to pathology.  The  Novasure instrument is then inserted easily with a cavity length of 6.5 cm  which is the maximum of the Novasure and a cavity width of 4.6 cm.  We  performed endometrial ablation at a power of 164 during 40 seconds after  assessing cavity integrity.  The Novasure is then removed and the operative  hysteroscope was then reinserted.  We do see a complete blanching of the  fundus of the uterus, the two cornua, anterior and posterior wall up until  about 2 cm from the cervical canal.  Because of that  we decide to complete  the endometrial ablation with rollerball.  The perfusion on the uterus is  changed to mannitol and the uterus is flushed for 2 minutes before  proceeding.  At a power of 50, we then cauterized and roller balled the  lower uterine segment until complete blanching.  Attention is placed not to  cauterize the cervical canal to avoid long term complications.  The  instruments are then removed.  Instruments and sponge count is complete x2.  Estimated blood loss is  minimal.  A bleeding site on the anterior lip of the cervix is then  controlled with a figure-of-eight stitch of 3-0 chromic.   The procedure was very well tolerated by the patient who was taken to  recovery room in a well and stable condition.      Wanda [name], M.D.  Electronically Signed     SAR/MEDQ  D:  07/13/2006  T:  07/13/2006  Job:  [id number]

## 2011-06-06 ENCOUNTER — Other Ambulatory Visit: Payer: Self-pay | Admitting: Family Medicine

## 2011-07-13 ENCOUNTER — Other Ambulatory Visit: Payer: Self-pay | Admitting: Family Medicine

## 2011-07-17 ENCOUNTER — Other Ambulatory Visit: Payer: Self-pay | Admitting: Family Medicine

## 2011-07-24 ENCOUNTER — Emergency Department (HOSPITAL_COMMUNITY)
Admission: EM | Admit: 2011-07-24 | Discharge: 2011-07-25 | Disposition: A | Discharge status: Home / Self Care | Payer: BC Managed Care – PPO | Attending: Emergency Medicine | Admitting: Emergency Medicine

## 2011-07-24 DIAGNOSIS — R110 Nausea: Secondary | ICD-10-CM | POA: Insufficient documentation

## 2011-07-24 DIAGNOSIS — H53149 Visual discomfort, unspecified: Secondary | ICD-10-CM | POA: Insufficient documentation

## 2011-07-24 DIAGNOSIS — I10 Essential (primary) hypertension: Secondary | ICD-10-CM | POA: Insufficient documentation

## 2011-07-24 DIAGNOSIS — G43909 Migraine, unspecified, not intractable, without status migrainosus: Secondary | ICD-10-CM | POA: Insufficient documentation

## 2011-08-10 ENCOUNTER — Other Ambulatory Visit: Payer: Self-pay | Admitting: Family Medicine

## 2011-08-11 NOTE — Telephone Encounter (Signed)
30 day supply given--Letter mailed to schedule and OV    KP

## 2011-08-15 ENCOUNTER — Ambulatory Visit: Payer: BC Managed Care – PPO | Admitting: Family Medicine

## 2011-09-02 LAB — CULTURE, ROUTINE-ABSCESS: Gram Stain: NONE SEEN

## 2011-09-09 ENCOUNTER — Other Ambulatory Visit: Payer: Self-pay | Admitting: Family Medicine

## 2011-09-09 MED ORDER — AMLODIPINE BESYLATE 5 MG PO TABS: 5.0000 mg | ORAL_TABLET | Freq: Every day | ORAL | Status: DC

## 2011-11-11 ENCOUNTER — Other Ambulatory Visit: Payer: Self-pay | Admitting: Family Medicine

## 2011-12-12 ENCOUNTER — Other Ambulatory Visit: Payer: Self-pay | Admitting: Family Medicine

## 2012-01-12 ENCOUNTER — Other Ambulatory Visit: Payer: Self-pay | Admitting: Family Medicine

## 2012-06-25 ENCOUNTER — Emergency Department (HOSPITAL_COMMUNITY)
Admission: EM | Admit: 2012-06-25 | Discharge: 2012-06-26 | Disposition: A | Discharge status: Home / Self Care | Payer: BC Managed Care – PPO | Attending: Emergency Medicine | Admitting: Emergency Medicine

## 2012-06-25 ENCOUNTER — Encounter (HOSPITAL_COMMUNITY): Payer: Self-pay | Admitting: Emergency Medicine

## 2012-06-25 DIAGNOSIS — M791 Myalgia, unspecified site: Secondary | ICD-10-CM

## 2012-06-25 DIAGNOSIS — J029 Acute pharyngitis, unspecified: Secondary | ICD-10-CM | POA: Insufficient documentation

## 2012-06-25 DIAGNOSIS — R509 Fever, unspecified: Secondary | ICD-10-CM

## 2012-06-25 DIAGNOSIS — J40 Bronchitis, not specified as acute or chronic: Secondary | ICD-10-CM | POA: Insufficient documentation

## 2012-06-25 MED ORDER — ACETAMINOPHEN 325 MG PO TABS
650.0000 mg | ORAL_TABLET | Freq: Once | ORAL | Status: AC
  Administered 2012-06-25: 650 mg via ORAL
  Filled 2012-06-25: qty 2

## 2012-06-25 NOTE — ED Notes (Signed)
Pt states it hurts to breathe  Pt states her body aches all over and it even hurts to swallow  Pt states sxs started today Pt has a fever of 103.1 in triage

## 2012-06-26 ENCOUNTER — Emergency Department (HOSPITAL_COMMUNITY): Payer: BC Managed Care – PPO

## 2012-06-26 MED ORDER — KETOROLAC TROMETHAMINE 30 MG/ML IJ SOLN
30.0000 mg | Freq: Once | INTRAMUSCULAR | Status: AC
  Administered 2012-06-26: 30 mg via INTRAVENOUS
  Filled 2012-06-26: qty 1

## 2012-06-26 MED ORDER — ONDANSETRON 8 MG PO TBDP: 8.0000 mg | ORAL_TABLET | Freq: Three times a day (TID) | ORAL | Status: AC | PRN

## 2012-06-26 MED ORDER — AZITHROMYCIN 250 MG PO TABS: ORAL_TABLET | ORAL | Status: AC

## 2012-06-26 MED ORDER — SODIUM CHLORIDE 0.9 % IV SOLN
1000.0000 mL | INTRAVENOUS | Status: DC
  Administered 2012-06-26: 1000 mL via INTRAVENOUS

## 2012-06-26 MED ORDER — SODIUM CHLORIDE 0.9 % IV SOLN
1000.0000 mL | Freq: Once | INTRAVENOUS | Status: AC
  Administered 2012-06-26: 1000 mL via INTRAVENOUS

## 2012-06-26 MED ORDER — ONDANSETRON HCL 4 MG/2ML IJ SOLN: 4.0000 mg | Freq: Four times a day (QID) | INTRAMUSCULAR | Status: DC | PRN

## 2012-06-26 NOTE — ED Provider Notes (Signed)
History     CSN: [id number]  Arrival date & time 06/25/12  2203   First MD Initiated Contact with Patient 06/25/12 2350      Chief Complaint  Patient presents with  . Generalized Body Aches  . Fever  . Sore Throat    (Consider location/radiation/quality/duration/timing/severity/associated sxs/prior treatment) HPI  Patient reports this afternoon she started having diffuse myalgias, sore throat, with nausea and vomiting wife. She states both her ears hurt. She denies coughing, diarrhea, rhinorrhea. She states her head is pounding. She was unaware she was having a fever. She reports she's been exposed to 2 other family members with similar symptoms.  PCP Velna Hatchet  Past Medical History  Diagnosis Date  . Migraine   . Hypertension     Past Surgical History  Procedure Date  . Cesarean section   . Cholecystectomy     Family History  Problem Relation Age of Onset  . Breast cancer      1st degree relative< 50  . Hypertension    . Kidney disease Brother     dialysis passed away 38 yrs    History  Substance Use Topics  . Smoking status: Never Smoker   . Smokeless tobacco: Not on file  . Alcohol Use: No  employed  OB History    Grav Para Term Preterm Abortions TAB SAB Ect Mult Living                  Review of Systems  All other systems reviewed and are negative.    Allergies  Review of patient's allergies indicates no known allergies.  Home Medications   Current Outpatient Rx  Name Route Sig Dispense Refill  . AMLODIPINE BESYLATE 5 MG PO TABS  TAKE ONE TABLET BY MOUTH EVERY DAY 30 tablet 0    Office visit due now  . PRESCRIPTION MEDICATION Subcutaneous Inject 1 mL into the skin 3 (three) times a week. immunization therapy for allergies    . VITAMIN D (ERGOCALCIFEROL) 50000 UNITS PO CAPS Oral Take 50,000 Units by mouth once a week.        BP 121/80  Pulse 108  Temp 103.1 F (39.5 C) (Oral)  Resp 20  SpO2 100%  LMP 06/06/2012  Vital signs  normal except tachycardia   Physical Exam  Nursing note and vitals reviewed. Constitutional: She is oriented to person, place, and time. She appears well-developed and well-nourished.  Non-toxic appearance. She does not appear ill. No distress.  HENT:  Head: Normocephalic and atraumatic.  Nose: Nose normal. No mucosal edema or rhinorrhea.  Mouth/Throat: Oropharynx is clear and moist and mucous membranes are normal. No dental abscesses or uvula swelling.       Bilateral cerumen impaction Tonsils enlarged diffusely without erythema or exudates  Eyes: Conjunctivae and EOM are normal. Pupils are equal, round, and reactive to light.  Neck: Normal range of motion and full passive range of motion without pain. Neck supple.  Cardiovascular: Normal rate, regular rhythm and normal heart sounds.  Exam reveals no gallop and no friction rub.   No murmur heard. Pulmonary/Chest: Effort normal and breath sounds normal. No respiratory distress. She has no wheezes. She has no rhonchi. She has no rales. She exhibits tenderness. She exhibits no crepitus.       Tender over the costochondral junctions superiorly bilaterally  Abdominal: Soft. Normal appearance and bowel sounds are normal. She exhibits no distension. There is no tenderness. There is no rebound and no guarding.  Musculoskeletal:  Normal range of motion. She exhibits no edema and no tenderness.       Moves all extremities well.   Neurological: She is alert and oriented to person, place, and time. She has normal strength. No cranial nerve deficit.  Skin: Skin is warm, dry and intact. No rash noted. No erythema. No pallor.  Psychiatric: She has a normal mood and affect. Her speech is normal and behavior is normal. Her mood appears not anxious.    ED Course  Procedures (including critical care time)   Medications  0.9 %  sodium chloride infusion (1000 mL Intravenous New Bag/Given 06/26/12 0233)    Followed by  0.9 %  sodium chloride infusion (1000  mL Intravenous New Bag/Given 06/26/12 0344)  ondansetron (ZOFRAN) injection 4 mg (not administered)  acetaminophen (TYLENOL) tablet 650 mg (650 mg Oral Given 06/25/12 2341)  ketorolac (TORADOL) 30 MG/ML injection 30 mg (30 mg Intravenous Given 06/26/12 0234)   Pt is feeling better and ready to go home.    Results for orders placed during the hospital encounter of 06/25/12  RAPID STREP SCREEN      Component Value Range   Streptococcus, Group A Screen (Direct) NEGATIVE  NEGATIVE      Dg Chest 2 View  06/26/2012  *RADIOLOGY REPORT*  Clinical Data: Body aches  CHEST - 2 VIEW  Comparison: None.  Findings: Normal heart size.  Clear lungs.  IMPRESSION: Negative.  Original Report Authenticated By: Donavan Burnet, M.D.     1. Fever   2. Myalgia   3. Pharyngitis   4. Bronchitis    Patient's Medications  New Prescriptions   AZITHROMYCIN (ZITHROMAX Z-PAK) 250 MG TABLET    Take 2 po the first day then once a day for the next 4 days.   ONDANSETRON (ZOFRAN ODT) 8 MG DISINTEGRATING TABLET    Take 1 tablet (8 mg total) by mouth every 8 (eight) hours as needed for nausea.   Plan discharge  Devoria Albe, MD, FACEP    MDM          Ward Givens, MD 06/26/12 [id number]

## 2012-06-26 NOTE — ED Notes (Signed)
Pt c/o sore throat and generalized body aches x1 day. Pt denies NVD. Pt presented with 103 fever and was given tylenol in triage. Pt's temp was 98.3 on assessment.

## 2012-06-26 NOTE — ED Notes (Signed)
MD at bedside. 

## 2012-11-09 ENCOUNTER — Other Ambulatory Visit: Payer: Self-pay | Admitting: Obstetrics and Gynecology

## 2012-11-09 DIAGNOSIS — Z803 Family history of malignant neoplasm of breast: Secondary | ICD-10-CM

## 2012-12-09 ENCOUNTER — Other Ambulatory Visit: Payer: BC Managed Care – PPO

## 2012-12-22 ENCOUNTER — Encounter (HOSPITAL_COMMUNITY): Payer: Self-pay | Admitting: Emergency Medicine

## 2012-12-22 ENCOUNTER — Emergency Department (HOSPITAL_COMMUNITY): Payer: BC Managed Care – PPO

## 2012-12-22 ENCOUNTER — Emergency Department (HOSPITAL_COMMUNITY)
Admission: EM | Admit: 2012-12-22 | Discharge: 2012-12-22 | Disposition: A | Discharge status: Home / Self Care | Payer: BC Managed Care – PPO | Attending: Emergency Medicine | Admitting: Emergency Medicine

## 2012-12-22 DIAGNOSIS — Z79899 Other long term (current) drug therapy: Secondary | ICD-10-CM | POA: Insufficient documentation

## 2012-12-22 DIAGNOSIS — R112 Nausea with vomiting, unspecified: Secondary | ICD-10-CM | POA: Insufficient documentation

## 2012-12-22 DIAGNOSIS — G43909 Migraine, unspecified, not intractable, without status migrainosus: Secondary | ICD-10-CM | POA: Insufficient documentation

## 2012-12-22 DIAGNOSIS — R5381 Other malaise: Secondary | ICD-10-CM | POA: Insufficient documentation

## 2012-12-22 DIAGNOSIS — R509 Fever, unspecified: Secondary | ICD-10-CM | POA: Insufficient documentation

## 2012-12-22 DIAGNOSIS — J40 Bronchitis, not specified as acute or chronic: Secondary | ICD-10-CM

## 2012-12-22 DIAGNOSIS — R6889 Other general symptoms and signs: Secondary | ICD-10-CM

## 2012-12-22 DIAGNOSIS — R55 Syncope and collapse: Secondary | ICD-10-CM | POA: Insufficient documentation

## 2012-12-22 DIAGNOSIS — Z3202 Encounter for pregnancy test, result negative: Secondary | ICD-10-CM | POA: Insufficient documentation

## 2012-12-22 DIAGNOSIS — IMO0001 Reserved for inherently not codable concepts without codable children: Secondary | ICD-10-CM | POA: Insufficient documentation

## 2012-12-22 DIAGNOSIS — J3489 Other specified disorders of nose and nasal sinuses: Secondary | ICD-10-CM | POA: Insufficient documentation

## 2012-12-22 DIAGNOSIS — N390 Urinary tract infection, site not specified: Secondary | ICD-10-CM

## 2012-12-22 DIAGNOSIS — J029 Acute pharyngitis, unspecified: Secondary | ICD-10-CM | POA: Insufficient documentation

## 2012-12-22 DIAGNOSIS — I10 Essential (primary) hypertension: Secondary | ICD-10-CM | POA: Insufficient documentation

## 2012-12-22 DIAGNOSIS — R51 Headache: Secondary | ICD-10-CM | POA: Insufficient documentation

## 2012-12-22 LAB — COMPREHENSIVE METABOLIC PANEL
ALT: 12 U/L (ref 0–35)
Alkaline Phosphatase: 65 U/L (ref 39–117)
CO2: 21 mEq/L (ref 19–32)
GFR calc Af Amer: 80 mL/min — ABNORMAL LOW (ref >90)
GFR calc non Af Amer: 69 mL/min — ABNORMAL LOW (ref >90)
Glucose, Bld: 128 mg/dL — ABNORMAL HIGH (ref 70–99)
Potassium: 3.7 mEq/L (ref 3.5–5.1)
Sodium: 136 mEq/L (ref 135–145)

## 2012-12-22 LAB — URINE MICROSCOPIC-ADD ON

## 2012-12-22 LAB — URINALYSIS, ROUTINE W REFLEX MICROSCOPIC
Bilirubin Urine: NEGATIVE
Hgb urine dipstick: NEGATIVE
Protein, ur: NEGATIVE mg/dL
Urobilinogen, UA: 1 mg/dL (ref 0.0–1.0)

## 2012-12-22 LAB — CBC WITH DIFFERENTIAL/PLATELET
Lymphocytes Relative: 25 % (ref 12–46)
Lymphs Abs: 2.6 10*3/uL (ref 0.7–4.0)
MCV: 81.3 fL (ref 78.0–100.0)
Neutrophils Relative %: 60 % (ref 43–77)
Platelets: 304 10*3/uL (ref 150–400)
RBC: 5.12 MIL/uL — ABNORMAL HIGH (ref 3.87–5.11)
WBC: 10.5 10*3/uL (ref 4.0–10.5)

## 2012-12-22 MED ORDER — PSEUDOEPHEDRINE HCL 30 MG PO TABS: 30.0000 mg | ORAL_TABLET | ORAL | Status: DC | PRN

## 2012-12-22 MED ORDER — SODIUM CHLORIDE 0.9 % IV BOLUS (SEPSIS)
1000.0000 mL | Freq: Once | INTRAVENOUS | Status: AC
  Administered 2012-12-22: 1000 mL via INTRAVENOUS

## 2012-12-22 MED ORDER — HYDROCODONE-HOMATROPINE 5-1.5 MG/5ML PO SYRP: 5.0000 mL | ORAL_SOLUTION | Freq: Four times a day (QID) | ORAL | Status: DC | PRN

## 2012-12-22 MED ORDER — ACETAMINOPHEN 325 MG PO TABS
650.0000 mg | ORAL_TABLET | Freq: Once | ORAL | Status: AC
  Administered 2012-12-22: 650 mg via ORAL
  Filled 2012-12-22: qty 2

## 2012-12-22 MED ORDER — GUAIFENESIN 100 MG/5ML PO SYRP: 100.0000 mg | ORAL_SOLUTION | ORAL | Status: DC | PRN

## 2012-12-22 MED ORDER — ONDANSETRON 8 MG PO TBDP
8.0000 mg | ORAL_TABLET | Freq: Once | ORAL | Status: AC
  Administered 2012-12-22: 8 mg via ORAL
  Filled 2012-12-22: qty 1

## 2012-12-22 MED ORDER — ONDANSETRON 8 MG PO TBDP: 8.0000 mg | ORAL_TABLET | Freq: Three times a day (TID) | ORAL | Status: DC | PRN

## 2012-12-22 MED ORDER — AZITHROMYCIN 250 MG PO TABS: 250.0000 mg | ORAL_TABLET | Freq: Every day | ORAL | Status: DC

## 2012-12-22 MED ORDER — ONDANSETRON HCL 4 MG PO TABS: 4.0000 mg | ORAL_TABLET | Freq: Three times a day (TID) | ORAL | Status: DC | PRN

## 2012-12-22 MED ORDER — NITROFURANTOIN MONOHYD MACRO 100 MG PO CAPS: 100.0000 mg | ORAL_CAPSULE | Freq: Two times a day (BID) | ORAL | Status: DC

## 2012-12-22 MED ORDER — AZITHROMYCIN 1 G PO PACK: 1.0000 | PACK | Freq: Once | ORAL | Status: DC

## 2012-12-22 MED ORDER — HYDROCOD POLST-CHLORPHEN POLST 10-8 MG/5ML PO LQCR
5.0000 mL | Freq: Once | ORAL | Status: AC
  Administered 2012-12-22: 5 mL via ORAL
  Filled 2012-12-22: qty 5

## 2012-12-22 MED ORDER — ONDANSETRON HCL 4 MG/2ML IJ SOLN
4.0000 mg | Freq: Once | INTRAMUSCULAR | Status: AC
  Administered 2012-12-22: 4 mg via INTRAVENOUS
  Filled 2012-12-22: qty 2

## 2012-12-22 NOTE — ED Notes (Signed)
Pt became diaphoretic and hypotensive. PA aware.

## 2012-12-22 NOTE — ED Notes (Signed)
Pt has been having gen body aches, fever, runny nose, headache and cough since Saturday.

## 2012-12-22 NOTE — ED Notes (Signed)
PA at bedside.

## 2012-12-22 NOTE — ED Notes (Addendum)
Patient stated that she can not give urine sample at this time.  

## 2012-12-22 NOTE — ED Notes (Signed)
Pt beginning to feel better. BP continuing to rise.

## 2012-12-22 NOTE — ED Notes (Signed)
ION:GE95<MW> Expected date:<BR> Expected time:<BR> Means of arrival:<BR> Comments:<BR> Hold for triage 5

## 2012-12-22 NOTE — ED Provider Notes (Signed)
History     CSN: [id number]  Arrival date & time 12/22/12  1258   First MD Initiated Contact with Patient 12/22/12 1443      Chief Complaint  Patient presents with  . Cough  . Generalized Body Aches    (Consider location/radiation/quality/duration/timing/severity/associated sxs/prior treatment) HPI Wanda [name] is a 42 y.o. female who presents with complaint of URI symptoms, cough, congestion, sore throat, body aches, fever for 4 days. States taking over the counter cold and flu medication with no improvement. Taking both tylenol and motrin for fever. States took ibuprofen earlier today. Pt also states she has nausea, and vomiting after eating food. Able to drink fluids. Denies photophobia, neck pain or stiffness, abdominal pain. States everyone in her house sick with the same.    Past Medical History  Diagnosis Date  . Migraine   . Hypertension     Past Surgical History  Procedure Date  . Cesarean section   . Cholecystectomy     Family History  Problem Relation Age of Onset  . Breast cancer      1st degree relative< 50  . Hypertension    . Kidney disease Brother     dialysis passed away 38 yrs    History  Substance Use Topics  . Smoking status: Never Smoker   . Smokeless tobacco: Not on file  . Alcohol Use: No    OB History    Grav Para Term Preterm Abortions TAB SAB Ect Mult Living                  Review of Systems  Constitutional: Positive for fever, chills, activity change, appetite change and fatigue.  HENT: Positive for congestion, sore throat and rhinorrhea. Negative for neck pain.   Eyes: Negative for photophobia.  Respiratory: Positive for cough.   Cardiovascular: Negative.   Gastrointestinal: Positive for nausea and vomiting. Negative for abdominal pain and diarrhea.  Genitourinary: Negative for dysuria and flank pain.  Musculoskeletal: Positive for myalgias and arthralgias.  Skin: Negative for rash.  Neurological: Positive for weakness  and headaches. Negative for numbness.    Allergies  Review of patient's allergies indicates no known allergies.  Home Medications   Current Outpatient Rx  Name  Route  Sig  Dispense  Refill  . AMLODIPINE BESYLATE 5 MG PO TABS      TAKE ONE TABLET BY MOUTH EVERY DAY   30 tablet   0     Office visit due now   . CHLORPHEN-PE-ACETAMINOPHEN 2-5-325 MG PO TABS   Oral   Take 1 tablet by mouth at bedtime.         Marland Kitchen PRESCRIPTION MEDICATION   Subcutaneous   Inject 1 mL into the skin 3 (three) times a week. immunization therapy for allergies         . TOPIRAMATE 25 MG PO TABS   Oral   Take 50 mg by mouth at bedtime.           BP 125/98  Pulse 111  Temp 100.2 F (37.9 C) (Oral)  SpO2 96%  LMP 11/17/2012  Physical Exam  Nursing note and vitals reviewed. Constitutional: She is oriented to person, place, and time. She appears well-developed and well-nourished. No distress.  HENT:  Head: Normocephalic and atraumatic.  Right Ear: External ear and ear canal normal.  Left Ear: External ear and ear canal normal.  Nose: Rhinorrhea present.  Mouth/Throat: Uvula is midline. Posterior oropharyngeal erythema present. No oropharyngeal exudate, posterior oropharyngeal  edema or tonsillar abscesses.  Eyes: Conjunctivae normal are normal.  Neck: Neck supple.  Cardiovascular: Normal rate, regular rhythm and normal heart sounds.   Pulmonary/Chest: Effort normal and breath sounds normal. No respiratory distress. She has no wheezes. She has no rales.  Abdominal: Soft. Bowel sounds are normal. She exhibits no distension. There is no tenderness. There is no rebound.  Musculoskeletal: She exhibits no edema.  Neurological: She is alert and oriented to person, place, and time.  Skin: Skin is warm and dry. No rash noted.    ED Course  Procedures (including critical care time)  Pt with URI symptoms, cough. Will get CXR. Cough medication and zofran ordered.   Dg Chest 2 View  12/22/2012   *RADIOLOGY REPORT*  Clinical Data: Cough, body aches and fever.  CHEST - 2 VIEW  Comparison: PA and lateral chest 06/26/2012.  Findings: Lungs are clear.  Heart size is normal.  No pneumothorax or pleural fluid.  IMPRESSION: No acute disease.   Original Report Authenticated By: Holley Dexter, M.D.     5:00 PM Pt had a vagal episode, states suddenly got diaphoretic, nauseated, light headed. BP checked at that time, and was 80/38. Pt layed down, will keep an eye on and recheck.   Pt signed out with PA Reola Calkins. Will d/c home once BP is up.   1. Flu-like symptoms   2. Bronchitis   3. UTI (urinary tract infection)       MDM          Lottie Mussel, PA 12/23/12 1546

## 2012-12-23 LAB — URINE CULTURE: Colony Count: NO GROWTH

## 2012-12-24 NOTE — ED Provider Notes (Signed)
Medical screening examination/treatment/procedure(s) were performed by non-physician practitioner and as supervising physician I was immediately available for consultation/collaboration.  Gilda Crease, MD 12/24/12 [phone]

## 2013-02-08 ENCOUNTER — Encounter (HOSPITAL_COMMUNITY): Payer: Self-pay | Admitting: *Deleted

## 2013-02-08 ENCOUNTER — Emergency Department (HOSPITAL_COMMUNITY): Payer: BC Managed Care – PPO

## 2013-02-08 ENCOUNTER — Emergency Department (HOSPITAL_COMMUNITY)
Admission: EM | Admit: 2013-02-08 | Discharge: 2013-02-08 | Disposition: A | Discharge status: Home / Self Care | Payer: BC Managed Care – PPO | Attending: Emergency Medicine | Admitting: Emergency Medicine

## 2013-02-08 DIAGNOSIS — S0993XA Unspecified injury of face, initial encounter: Secondary | ICD-10-CM | POA: Insufficient documentation

## 2013-02-08 DIAGNOSIS — M25562 Pain in left knee: Secondary | ICD-10-CM

## 2013-02-08 DIAGNOSIS — M25511 Pain in right shoulder: Secondary | ICD-10-CM

## 2013-02-08 DIAGNOSIS — Y939 Activity, unspecified: Secondary | ICD-10-CM | POA: Insufficient documentation

## 2013-02-08 DIAGNOSIS — S46909A Unspecified injury of unspecified muscle, fascia and tendon at shoulder and upper arm level, unspecified arm, initial encounter: Secondary | ICD-10-CM | POA: Insufficient documentation

## 2013-02-08 DIAGNOSIS — G43909 Migraine, unspecified, not intractable, without status migrainosus: Secondary | ICD-10-CM | POA: Insufficient documentation

## 2013-02-08 DIAGNOSIS — Y929 Unspecified place or not applicable: Secondary | ICD-10-CM | POA: Insufficient documentation

## 2013-02-08 DIAGNOSIS — M62838 Other muscle spasm: Secondary | ICD-10-CM

## 2013-02-08 DIAGNOSIS — I10 Essential (primary) hypertension: Secondary | ICD-10-CM | POA: Insufficient documentation

## 2013-02-08 DIAGNOSIS — W108XXA Fall (on) (from) other stairs and steps, initial encounter: Secondary | ICD-10-CM | POA: Insufficient documentation

## 2013-02-08 DIAGNOSIS — Z79899 Other long term (current) drug therapy: Secondary | ICD-10-CM | POA: Insufficient documentation

## 2013-02-08 DIAGNOSIS — S8990XA Unspecified injury of unspecified lower leg, initial encounter: Secondary | ICD-10-CM | POA: Insufficient documentation

## 2013-02-08 DIAGNOSIS — S4980XA Other specified injuries of shoulder and upper arm, unspecified arm, initial encounter: Secondary | ICD-10-CM | POA: Insufficient documentation

## 2013-02-08 MED ORDER — HYDROCODONE-ACETAMINOPHEN 5-325 MG PO TABS: 1.0000 | ORAL_TABLET | Freq: Once | ORAL | Status: DC

## 2013-02-08 MED ORDER — HYDROCODONE-ACETAMINOPHEN 5-325 MG PO TABS
1.0000 | ORAL_TABLET | Freq: Once | ORAL | Status: AC
  Administered 2013-02-08: 1 via ORAL
  Filled 2013-02-08: qty 1

## 2013-02-08 MED ORDER — METHOCARBAMOL 500 MG PO TABS
500.0000 mg | ORAL_TABLET | Freq: Once | ORAL | Status: AC
  Administered 2013-02-08: 500 mg via ORAL
  Filled 2013-02-08: qty 1

## 2013-02-08 MED ORDER — IBUPROFEN 800 MG PO TABS
800.0000 mg | ORAL_TABLET | Freq: Once | ORAL | Status: AC
  Administered 2013-02-08: 800 mg via ORAL
  Filled 2013-02-08: qty 1

## 2013-02-08 MED ORDER — METHOCARBAMOL 500 MG PO TABS: 500.0000 mg | ORAL_TABLET | Freq: Two times a day (BID) | ORAL | Status: DC

## 2013-02-08 MED ORDER — IBUPROFEN 800 MG PO TABS: 800.0000 mg | ORAL_TABLET | Freq: Once | ORAL | Status: DC

## 2013-02-08 NOTE — ED Notes (Signed)
Pt c/o right shoulder pain; fell down stairs yesterday; c/o right side aching; states having right shoulder pain before the fall/worse since fall

## 2013-02-08 NOTE — ED Provider Notes (Signed)
History  This chart was scribed for non-physician practitioner working with Hilario Quarry, MD, by Candelaria Stagers, ED Scribe. This patient was seen in room WTR5/WTR5 and the patient's care was started at 10:20 PM   CSN: [id number]  Arrival date & time 02/08/13  2025   First MD Initiated Contact with Patient 02/08/13 2157      Chief Complaint  Patient presents with  . Arm Pain    The history is provided by the patient. No language interpreter was used.   Wanda [name] is a 42 y.o. female who presents to the Emergency Department complaining of right shoulder pain with gradual onset after she fell down the stairs yesterday.  Moving the arm makes the shoulder pain worse.  She is also complaining of left sided neck pain and left knee pain.  Pt denies hitting her head or LOC.  She was able to get up on her own after the fall.  Pt has taken flexeril with no relief.  Pt is ambulatory.      Past Medical History  Diagnosis Date  . Migraine   . Hypertension     Past Surgical History  Procedure Laterality Date  . Cesarean section    . Cholecystectomy      Family History  Problem Relation Age of Onset  . Breast cancer      1st degree relative< 50  . Hypertension    . Kidney disease Brother     dialysis passed away 38 yrs    History  Substance Use Topics  . Smoking status: Never Smoker   . Smokeless tobacco: Not on file  . Alcohol Use: No    OB History   Grav Para Term Preterm Abortions TAB SAB Ect Mult Living                  Review of Systems  HENT: Positive for neck pain (left sided neck pain).   Musculoskeletal: Positive for arthralgias (left knee pain, right shoulder pain).  Neurological: Negative for syncope.  All other systems reviewed and are negative.    Allergies  Review of patient's allergies indicates no known allergies.  Home Medications   Current Outpatient Rx  Name  Route  Sig  Dispense  Refill  . amLODipine (NORVASC) 5 MG tablet      TAKE ONE  TABLET BY MOUTH EVERY DAY   30 tablet   0     Office visit due now   . PRESCRIPTION MEDICATION   Subcutaneous   Inject 1 mL into the skin 3 (three) times a week. immunization therapy for allergies         . topiramate (TOPAMAX) 25 MG tablet   Oral   Take 50 mg by mouth at bedtime.         Marland Kitchen HYDROcodone-acetaminophen (NORCO/VICODIN) 5-325 MG per tablet   Oral   Take 1-2 tablets by mouth once.   12 tablet   0   . ibuprofen (ADVIL,MOTRIN) 800 MG tablet   Oral   Take 1 tablet (800 mg total) by mouth once.   30 tablet   0   . methocarbamol (ROBAXIN) 500 MG tablet   Oral   Take 1 tablet (500 mg total) by mouth 2 (two) times daily.   12 tablet   0     BP 115/66  Pulse 100  Temp(Src) 98.3 F (36.8 C)  Resp 20  SpO2 98%  LMP 01/01/2013  Physical Exam  Nursing note and vitals  reviewed. Constitutional: She is oriented to person, place, and time. She appears well-developed and well-nourished. No distress.  HENT:  Head: Normocephalic and atraumatic.  Eyes: EOM are normal.  Neck: Spinous process tenderness and muscular tenderness present. No rigidity. No tracheal deviation and normal range of motion present.  Left para spinal muscle spasm.    Cardiovascular: Normal rate.   Pulmonary/Chest: Effort normal. No respiratory distress.  Musculoskeletal:       Right shoulder: She exhibits decreased range of motion (due to pain), tenderness (milde tenderness on palpation to deltoid insertion), pain and spasm. She exhibits no bony tenderness, no swelling, no effusion, no crepitus, no deformity, no laceration, normal pulse and normal strength.       Left knee: She exhibits normal range of motion, no swelling, no effusion, no ecchymosis, no deformity, no laceration, no erythema, normal alignment, no LCL laxity and normal patellar mobility. Tenderness (lateral tenderness) found.  Pt able to ambulate without limp or significant pain.   Neurological: She is alert and oriented to  person, place, and time. No cranial nerve deficit or sensory deficit.  Skin: Skin is warm and dry.  Psychiatric: She has a normal mood and affect. Her behavior is normal.    ED Course  Procedures   DIAGNOSTIC STUDIES: Oxygen Saturation is 98% on room air, normal by my interpretation.    COORDINATION OF CARE:  10:27 PM Discussed course of care with pt which includes antiinflammatory, muscle relaxer, and pain medication.  Will refer to orthopaedist.  Pt understands and agrees.    Labs Reviewed - No data to display Dg Shoulder Right  02/08/2013  *RADIOLOGY REPORT*  Clinical Data: Arm pain.  Injury.  RIGHT SHOULDER - 2+ VIEW  Comparison: None.  Findings: No acute fracture and no dislocation.  IMPRESSION: No acute bony pathology.   Original Report Authenticated By: Jolaine Click, M.D.      1. Muscle spasms of neck   2. Shoulder pain, acute, right   3. Knee pain, left       MDM  Pt has been advised of the symptoms that warrant their return to the ED. Patient has voiced understanding and has agreed to follow-up with the PCP or specialist.  I personally performed the services described in this documentation, which was scribed in my presence. The recorded information has been reviewed and is accurate.         Dorthula Matas, PA-C 02/08/13 2350

## 2013-02-09 NOTE — ED Provider Notes (Signed)
History/physical exam/procedure(s) were performed by non-physician practitioner and as supervising physician I was immediately available for consultation/collaboration. I have reviewed all notes and am in agreement with care and plan. I personally performed the services described in this documentation, which was scribed in my presence. The recorded information has been reviewed and considered.   Hilario Quarry, MD 02/09/13 [phone]

## 2013-03-04 ENCOUNTER — Ambulatory Visit (INDEPENDENT_AMBULATORY_CARE_PROVIDER_SITE_OTHER): Payer: Federal, State, Local not specified - PPO | Admitting: Neurology

## 2013-03-04 ENCOUNTER — Encounter: Payer: Self-pay | Admitting: Neurology

## 2013-03-04 VITALS — BP 110/79 | HR 90 | Ht 64.0 in | Wt 260.0 lb

## 2013-03-04 DIAGNOSIS — G43009 Migraine without aura, not intractable, without status migrainosus: Secondary | ICD-10-CM

## 2013-03-04 MED ORDER — HYDROCODONE-ACETAMINOPHEN 5-325 MG PO TABS: 1.0000 | ORAL_TABLET | Freq: Four times a day (QID) | ORAL | Status: DC | PRN

## 2013-03-04 MED ORDER — TOPIRAMATE 50 MG PO TABS: ORAL_TABLET | ORAL | Status: DC

## 2013-03-04 NOTE — Patient Instructions (Addendum)
  With the Topamax 50 mg tablet, take 2 at night for one week, then take three at night.  Migraine Headache A migraine headache is an intense, throbbing pain on one or both sides of your head. A migraine can last for 30 minutes to several hours. CAUSES  The exact cause of a migraine headache is not always known. However, a migraine may be caused when nerves in the brain become irritated and release chemicals that cause inflammation. This causes pain. SYMPTOMS  Pain on one or both sides of your head.  Pulsating or throbbing pain.  Severe pain that prevents daily activities.  Pain that is aggravated by any physical activity.  Nausea, vomiting, or both.  Dizziness.  Pain with exposure to bright lights, loud noises, or activity.  General sensitivity to bright lights, loud noises, or smells. Before you get a migraine, you may get warning signs that a migraine is coming (aura). An aura may include:  Seeing flashing lights.  Seeing bright spots, halos, or zig-zag lines.  Having tunnel vision or blurred vision.  Having feelings of numbness or tingling.  Having trouble talking.  Having muscle weakness. MIGRAINE TRIGGERS  Alcohol.  Smoking.  Stress.  Menstruation.  Aged cheeses.  Foods or drinks that contain nitrates, glutamate, aspartame, or tyramine.  Lack of sleep.  Chocolate.  Caffeine.  Hunger.  Physical exertion.  Fatigue.  Medicines used to treat chest pain (nitroglycerine), birth control pills, estrogen, and some blood pressure medicines. DIAGNOSIS  A migraine headache is often diagnosed based on:  Symptoms.  Physical examination.  A CT scan or MRI of your head. TREATMENT Medicines may be given for pain and nausea. Medicines can also be given to help prevent recurrent migraines.  HOME CARE INSTRUCTIONS  Only take over-the-counter or prescription medicines for pain or discomfort as directed by your caregiver. The use of long-term narcotics is  not recommended.  Lie down in a dark, quiet room when you have a migraine.  Keep a journal to find out what may trigger your migraine headaches. For example, write down:  What you eat and drink.  How much sleep you get.  Any change to your diet or medicines.  Limit alcohol consumption.  Quit smoking if you smoke.  Get 7 to 9 hours of sleep, or as recommended by your caregiver.  Limit stress.  Keep lights dim if bright lights bother you and make your migraines worse. SEEK IMMEDIATE MEDICAL CARE IF:   Your migraine becomes severe.  You have a fever.  You have a stiff neck.  You have vision loss.  You have muscular weakness or loss of muscle control.  You start losing your balance or have trouble walking.  You feel faint or pass out.  You have severe symptoms that are different from your first symptoms. MAKE SURE YOU:   Understand these instructions.  Will watch your condition.  Will get help right away if you are not doing well or get worse. Document Released: 11/17/2005 Document Revised: 02/09/2012 Document Reviewed: 11/07/2011 [facility] Patient Information 2013 [place], [place].

## 2013-03-04 NOTE — Progress Notes (Signed)
Reason for visit: Headache  Wanda [name] is an 42 y.o. female  History of present illness:  Wanda [name] is a 42 year old black female with a history of migraine headaches. The patient has had some improvement with the use of Topamax, currently taking 75 mg at night. The patient however, is still having on average 7 headaches a month, but she goes on to say that the headaches may last 2 or 3 days at a time. The patient is missing work with the headaches that may be associated with nausea and vomiting. The Imitrex did not work for her headaches. The patient indicates that stress may worsen headaches. During the headaches, she does have photophobia and phonophobia. The patient returns for an evaluation.  Past Medical History  Diagnosis Date  . Migraine   . Hypertension   . Obesity     Past Surgical History  Procedure Laterality Date  . Cesarean section    . Cholecystectomy    . Endometrial ablation      Family History  Problem Relation Age of Onset  . Breast cancer      1st degree relative< 50  . Hypertension    . Kidney disease Brother     dialysis passed away 38 yrs  . Breast cancer Mother   . Cancer Maternal Uncle     Breast cancer  . Cancer Maternal Grandmother     Breast cancer    Social history:  reports that she has never smoked. She does not have any smokeless tobacco history on file. She reports that she does not drink alcohol. Her drug history is not on file.  Allergies: No Known Allergies  Medications:  Current Outpatient Prescriptions on File Prior to Visit  Medication Sig Dispense Refill  . amLODipine (NORVASC) 5 MG tablet TAKE ONE TABLET BY MOUTH EVERY DAY  30 tablet  0  . ibuprofen (ADVIL,MOTRIN) 800 MG tablet Take 1 tablet (800 mg total) by mouth once.  30 tablet  0  . methocarbamol (ROBAXIN) 500 MG tablet Take 1 tablet (500 mg total) by mouth 2 (two) times daily.  12 tablet  0  . PRESCRIPTION MEDICATION Inject 1 mL into the skin 3 (three) times a week.  immunization therapy for allergies       No current facility-administered medications on file prior to visit.    ROS:  Out of a complete 14 system review of symptoms, the patient complains only of the following symptoms, and all other reviewed systems are negative.  Snoring Headache Insomnia Allergies  Blood pressure 110/79, pulse 90, height 5\' 4"  (1.626 m), weight 260 lb (117.935 kg), last menstrual period 01/01/2013.  Physical Exam  General: The patient is alert and cooperative at the time of the examination. The patient is markedly obese.  Skin: No significant peripheral edema is noted.   Neurologic Exam  Cranial nerves: Facial symmetry is present. Speech is normal, no aphasia or dysarthria is noted. Extraocular movements are full. Visual fields are full.  Motor: The patient has good strength in all 4 extremities.  Coordination: The patient has good finger-nose-finger and heel-to-shin bilaterally.  Gait and station: The patient has a normal gait. Tandem gait is normal. Romberg is negative. No drift is seen.  Reflexes: Deep tendon reflexes are symmetric, but are depressed.   Assessment/Plan:  1. Intractable migraine  The patient will be increased on the Topamax taking 100 mg at night for one week, and then she will go to 150 mg at night. The  patient will be taken off of Imitrex, and switched to Migranal. Samples were given, and if this is effective, she is to contact our office for a prescription. The patient was also given hydrocodone to take if needed. The patient also takes Excedrin Migraine or ibuprofen for the headache. The patient has Zofran for nausea. The patient will followup in 3 or 4 months.  Marlan Palau MD 03/05/2013 8:46 AM  [facility] [address] [place], [place] [address]  Phone [phone] Fax [phone]

## 2013-07-15 ENCOUNTER — Emergency Department (HOSPITAL_COMMUNITY)
Admission: EM | Admit: 2013-07-15 | Discharge: 2013-07-15 | Disposition: A | Discharge status: Home / Self Care | Payer: BC Managed Care – PPO | Attending: Emergency Medicine | Admitting: Emergency Medicine

## 2013-07-15 ENCOUNTER — Encounter (HOSPITAL_COMMUNITY): Payer: Self-pay | Admitting: Emergency Medicine

## 2013-07-15 ENCOUNTER — Emergency Department (HOSPITAL_COMMUNITY): Payer: BC Managed Care – PPO

## 2013-07-15 DIAGNOSIS — I10 Essential (primary) hypertension: Secondary | ICD-10-CM | POA: Insufficient documentation

## 2013-07-15 DIAGNOSIS — IMO0002 Reserved for concepts with insufficient information to code with codable children: Secondary | ICD-10-CM | POA: Diagnosis present

## 2013-07-15 DIAGNOSIS — Z79899 Other long term (current) drug therapy: Secondary | ICD-10-CM | POA: Insufficient documentation

## 2013-07-15 DIAGNOSIS — Y9389 Activity, other specified: Secondary | ICD-10-CM | POA: Insufficient documentation

## 2013-07-15 DIAGNOSIS — M545 Low back pain, unspecified: Secondary | ICD-10-CM | POA: Insufficient documentation

## 2013-07-15 DIAGNOSIS — Y92410 Unspecified street and highway as the place of occurrence of the external cause: Secondary | ICD-10-CM | POA: Diagnosis not present

## 2013-07-15 DIAGNOSIS — M542 Cervicalgia: Secondary | ICD-10-CM | POA: Insufficient documentation

## 2013-07-15 DIAGNOSIS — M549 Dorsalgia, unspecified: Secondary | ICD-10-CM

## 2013-07-15 MED ORDER — METHOCARBAMOL 500 MG PO TABS: 500.0000 mg | ORAL_TABLET | Freq: Two times a day (BID) | ORAL | Status: DC | PRN

## 2013-07-15 MED ORDER — IBUPROFEN 800 MG PO TABS
800.0000 mg | ORAL_TABLET | Freq: Once | ORAL | Status: AC
  Administered 2013-07-15: 800 mg via ORAL
  Filled 2013-07-15: qty 1

## 2013-07-15 MED ORDER — OXYCODONE-ACETAMINOPHEN 5-325 MG PO TABS: 1.0000 | ORAL_TABLET | ORAL | Status: DC | PRN
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
## 2013-07-17 NOTE — ED Provider Notes (Signed)
Medical screening examination/treatment/procedure(s) were performed by non-physician practitioner and as supervising physician I was immediately available for consultation/collaboration.    Vida Roller, MD 07/17/13 [phone]

## 2013-07-28 ENCOUNTER — Ambulatory Visit: Payer: Federal, State, Local not specified - PPO | Admitting: Neurology

## 2013-10-06 ENCOUNTER — Other Ambulatory Visit: Payer: Self-pay

## 2013-10-10 ENCOUNTER — Other Ambulatory Visit: Payer: Self-pay | Admitting: Neurology

## 2013-11-04 ENCOUNTER — Other Ambulatory Visit: Payer: Self-pay | Admitting: Obstetrics and Gynecology

## 2013-11-04 DIAGNOSIS — Z803 Family history of malignant neoplasm of breast: Secondary | ICD-10-CM

## 2013-11-14 ENCOUNTER — Other Ambulatory Visit: Payer: Self-pay | Admitting: Obstetrics and Gynecology

## 2013-12-12 ENCOUNTER — Ambulatory Visit
Admission: RE | Admit: 2013-12-12 | Discharge: 2013-12-12 | Disposition: A | Discharge status: Home / Self Care | Payer: BC Managed Care – PPO | Source: Ambulatory Visit | Attending: Obstetrics and Gynecology | Admitting: Obstetrics and Gynecology

## 2013-12-12 DIAGNOSIS — Z803 Family history of malignant neoplasm of breast: Secondary | ICD-10-CM

## 2013-12-12 MED ORDER — GADOBENATE DIMEGLUMINE 529 MG/ML IV SOLN
20.0000 mL | Freq: Once | INTRAVENOUS | Status: AC | PRN
  Administered 2013-12-12: 20 mL via INTRAVENOUS

## 2013-12-14 ENCOUNTER — Other Ambulatory Visit: Payer: Self-pay | Admitting: Obstetrics and Gynecology

## 2013-12-14 DIAGNOSIS — R928 Other abnormal and inconclusive findings on diagnostic imaging of breast: Secondary | ICD-10-CM

## 2014-01-13 ENCOUNTER — Ambulatory Visit
Admission: RE | Admit: 2014-01-13 | Discharge: 2014-01-13 | Disposition: A | Discharge status: Home / Self Care | Payer: BC Managed Care – PPO | Source: Ambulatory Visit | Attending: Obstetrics and Gynecology | Admitting: Obstetrics and Gynecology

## 2014-01-13 ENCOUNTER — Other Ambulatory Visit: Payer: Self-pay | Admitting: Obstetrics and Gynecology

## 2014-01-13 DIAGNOSIS — R928 Other abnormal and inconclusive findings on diagnostic imaging of breast: Secondary | ICD-10-CM

## 2014-01-13 MED ORDER — GADOBENATE DIMEGLUMINE 529 MG/ML IV SOLN
20.0000 mL | Freq: Once | INTRAVENOUS | Status: AC | PRN
  Administered 2014-01-13: 20 mL via INTRAVENOUS

## 2014-01-16 ENCOUNTER — Other Ambulatory Visit: Payer: BC Managed Care – PPO

## 2014-02-14 ENCOUNTER — Ambulatory Visit: Payer: Federal, State, Local not specified - PPO | Admitting: Neurology

## 2014-06-07 ENCOUNTER — Telehealth: Payer: Self-pay | Admitting: *Deleted

## 2014-06-07 MED ORDER — HYDROCODONE-ACETAMINOPHEN 5-325 MG PO TABS: 1.0000 | ORAL_TABLET | Freq: Four times a day (QID) | ORAL | Status: DC | PRN

## 2014-06-07 NOTE — Telephone Encounter (Signed)
Patient states she has had a migraine for 2 days and would like to know if we will refill Hydrocodone to treat this.  Rx was last written 03/2013.  Please advise.  Thank you.   (Patient was last seen in April 2014.  Has an appt scheduled 07/31).

## 2014-06-07 NOTE — Telephone Encounter (Signed)
I called patient. The patient is having frequent headaches, 2 or 3 times a week. She has not been seen here since April 2014. The patient will be seen at the end of this month, I will give her a small prescription for hydrocodone.

## 2014-06-07 NOTE — Telephone Encounter (Signed)
Pt stopped by the office to picked up her Rx at the front desk today.

## 2014-06-30 ENCOUNTER — Encounter: Payer: Self-pay | Admitting: Neurology

## 2014-06-30 ENCOUNTER — Ambulatory Visit (INDEPENDENT_AMBULATORY_CARE_PROVIDER_SITE_OTHER): Payer: BC Managed Care – PPO | Admitting: Neurology

## 2014-06-30 VITALS — BP 106/80 | HR 72 | Wt 243.0 lb

## 2014-06-30 DIAGNOSIS — G43009 Migraine without aura, not intractable, without status migrainosus: Secondary | ICD-10-CM

## 2014-06-30 MED ORDER — ONDANSETRON HCL 4 MG PO TABS: 4.0000 mg | ORAL_TABLET | Freq: Three times a day (TID) | ORAL | Status: DC | PRN

## 2014-06-30 MED ORDER — TOPIRAMATE 50 MG PO TABS: 150.0000 mg | ORAL_TABLET | Freq: Every day | ORAL | Status: DC

## 2014-06-30 MED ORDER — OXYCODONE-ACETAMINOPHEN 5-325 MG PO TABS: 1.0000 | ORAL_TABLET | Freq: Four times a day (QID) | ORAL | Status: DC | PRN

## 2014-06-30 NOTE — Progress Notes (Signed)
Reason for visit: Headache  Wanda [name] is an 43 y.o. female  History of present illness:  Wanda [name] is a 43 year old right-handed black female with a history of migraine headaches. The patient has had frequent headaches, apparently she ran out of her medications recently, and began having 15 or 16 headaches a month. She now is back on Topamax, and her headache frequency has been cut in half. The patient works, and she tries not to miss work because of the headaches, but she will have some nausea at times. The patient has Zofran to take if needed, and in the past she has responded better to oxycodone and hydrocodone for her headache. The patient takes 100 mg Topamax at night. She is tolerating these medications well. She returns to this office for an evaluation. The patient indicates that heat exposure, perfumes, and certain foods such as chocolate and nuts may bring on headaches.  Past Medical History  Diagnosis Date  . Migraine   . Hypertension   . Obesity     Past Surgical History  Procedure Laterality Date  . Cesarean section    . Cholecystectomy    . Endometrial ablation      Family History  Problem Relation Age of Onset  . Breast cancer      1st degree relative< 50  . Hypertension    . Kidney disease Brother     dialysis passed away 76 yrs  . Breast cancer Mother   . Cancer Maternal Uncle     Breast cancer  . Cancer Maternal Grandmother     Breast cancer    Social history:  reports that she has never smoked. She has never used smokeless tobacco. She reports that she drinks alcohol. She reports that she does not use illicit drugs.   No Known Allergies  Medications:  Current Outpatient Prescriptions on File Prior to Visit  Medication Sig Dispense Refill  . amLODipine (NORVASC) 5 MG tablet TAKE ONE TABLET BY MOUTH EVERY DAY  30 tablet  0  . ibuprofen (ADVIL,MOTRIN) 800 MG tablet Take 1 tablet (800 mg total) by mouth once.  30 tablet  0  . PRESCRIPTION  MEDICATION Inject 1 mL into the skin 3 (three) times a week. immunization therapy for allergies      . methocarbamol (ROBAXIN) 500 MG tablet Take 1 tablet (500 mg total) by mouth 2 (two) times daily as needed.  14 tablet  0   No current facility-administered medications on file prior to visit.    ROS:  Out of a complete 14 system review of symptoms, the patient complains only of the following symptoms, and all other reviewed systems are negative.  Headache Neck stiffness  Blood pressure 106/80, pulse 72, weight 243 lb (110.224 kg).  Physical Exam  General: The patient is alert and cooperative at the time of the examination. The patient is moderately to markedly obese.  Skin: No significant peripheral edema is noted.   Neurologic Exam  Mental status: The patient is oriented x 3.  Cranial nerves: Facial symmetry is present. Speech is normal, no aphasia or dysarthria is noted. Extraocular movements are full. Visual fields are full.  Motor: The patient has good strength in all 4 extremities.  Sensory examination: Soft touch sensation is symmetric on the face, arms, and legs.  Coordination: The patient has good finger-nose-finger and heel-to-shin bilaterally.  Gait and station: The patient has a normal gait. Tandem gait is normal. Romberg is negative. No drift is seen.  Reflexes: Deep tendon reflexes are symmetric.   Assessment/Plan:  One. Migraine headache  The patient will continue the Topamax 100 mg at night. She will be given another prescription for Percocet and for Zofran. The patient will followup in the next 5 or 6 months. She will contact our office if she is not doing well.  Jill Alexanders MD 07/01/2014 10:21 AM  [facility] [address] [place],  [address]  Phone [phone] Fax [phone]

## 2014-06-30 NOTE — Patient Instructions (Signed)
Migraine Headache A migraine headache is an intense, throbbing pain on one or both sides of your head. A migraine can last for 30 minutes to several hours. CAUSES  The exact cause of a migraine headache is not always known. However, a migraine may be caused when nerves in the brain become irritated and release chemicals that cause inflammation. This causes pain. Certain things may also trigger migraines, such as:  Alcohol.  Smoking.  Stress.  Menstruation.  Aged cheeses.  Foods or drinks that contain nitrates, glutamate, aspartame, or tyramine.  Lack of sleep.  Chocolate.  Caffeine.  Hunger.  Physical exertion.  Fatigue.  Medicines used to treat chest pain (nitroglycerine), birth control pills, estrogen, and some blood pressure medicines. SIGNS AND SYMPTOMS  Pain on one or both sides of your head.  Pulsating or throbbing pain.  Severe pain that prevents daily activities.  Pain that is aggravated by any physical activity.  Nausea, vomiting, or both.  Dizziness.  Pain with exposure to bright lights, loud noises, or activity.  General sensitivity to bright lights, loud noises, or smells. Before you get a migraine, you may get warning signs that a migraine is coming (aura). An aura may include:  Seeing flashing lights.  Seeing bright spots, halos, or zigzag lines.  Having tunnel vision or blurred vision.  Having feelings of numbness or tingling.  Having trouble talking.  Having muscle weakness. DIAGNOSIS  A migraine headache is often diagnosed based on:  Symptoms.  Physical exam.  A CT scan or MRI of your head. These imaging tests cannot diagnose migraines, but they can help rule out other causes of headaches. TREATMENT Medicines may be given for pain and nausea. Medicines can also be given to help prevent recurrent migraines.  HOME CARE INSTRUCTIONS  Only take over-the-counter or prescription medicines for pain or discomfort as directed by your  health care provider. The use of long-term narcotics is not recommended.  Lie down in a dark, quiet room when you have a migraine.  Keep a journal to find out what may trigger your migraine headaches. For example, write down:  What you eat and drink.  How much sleep you get.  Any change to your diet or medicines.  Limit alcohol consumption.  Quit smoking if you smoke.  Get 7-9 hours of sleep, or as recommended by your health care provider.  Limit stress.  Keep lights dim if bright lights bother you and make your migraines worse. SEEK IMMEDIATE MEDICAL CARE IF:   Your migraine becomes severe.  You have a fever.  You have a stiff neck.  You have vision loss.  You have muscular weakness or loss of muscle control.  You start losing your balance or have trouble walking.  You feel faint or pass out.  You have severe symptoms that are different from your first symptoms. MAKE SURE YOU:   Understand these instructions.  Will watch your condition.  Will get help right away if you are not doing well or get worse. Document Released: 11/17/2005 Document Revised: 04/03/2014 Document Reviewed: 07/25/2013 [facility] Patient Information 2015 [place], [place]. This information is not intended to replace advice given to you by your health care provider. Make sure you discuss any questions you have with your health care provider.

## 2014-11-09 ENCOUNTER — Other Ambulatory Visit: Payer: Self-pay | Admitting: Obstetrics and Gynecology

## 2014-11-10 LAB — CYTOLOGY - PAP

## 2014-12-26 ENCOUNTER — Other Ambulatory Visit: Payer: Self-pay | Admitting: Obstetrics and Gynecology

## 2015-01-03 ENCOUNTER — Ambulatory Visit (INDEPENDENT_AMBULATORY_CARE_PROVIDER_SITE_OTHER): Payer: BC Managed Care – PPO | Admitting: Adult Health

## 2015-01-03 ENCOUNTER — Encounter: Payer: Self-pay | Admitting: Adult Health

## 2015-01-03 VITALS — BP 110/81 | HR 96 | Ht 65.0 in | Wt 234.0 lb

## 2015-01-03 DIAGNOSIS — G43019 Migraine without aura, intractable, without status migrainosus: Secondary | ICD-10-CM

## 2015-01-03 MED ORDER — OXYCODONE-ACETAMINOPHEN 5-325 MG PO TABS: 1.0000 | ORAL_TABLET | Freq: Four times a day (QID) | ORAL | Status: DC | PRN

## 2015-01-03 MED ORDER — TOPIRAMATE 50 MG PO TABS: 200.0000 mg | ORAL_TABLET | Freq: Every day | ORAL | Status: DC

## 2015-01-03 NOTE — Progress Notes (Signed)
PATIENT: Wanda [name] DOB: [birth year]  REASON FOR VISIT: follow up- migraines  HISTORY FROM: patient  HISTORY OF PRESENT ILLNESS: Wanda [name] is a 44 year old female with a history of migraines. She returns today for follow-up. She is currently taking Topamax 150 mg at bedtime. She states that her headaches have improved. She has approximately 7 headaches per month. She will have nausea with her migraines and will use Zofran. confirms photophobia Denies phonophobia. For severe migraines she will use Percocet and states that they works well. Her headaches are normally located on the left temporal region. She states that the last couple of migraines have been trigger by smells. 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   HISTORY 06/30/14 (WILLIS): Wanda [name] is a 44 year old right-handed black female with a history of migraine headaches. The patient has had frequent headaches, apparently she ran out of her medications recently, and began having 15 or 16 headaches a month. She now is back on Topamax, and her headache frequency has been cut in half. The patient works, and she tries not to miss work because of the headaches, but she will have some nausea at times. The patient has Zofran to take if needed, and in the past she has responded better to oxycodone and hydrocodone for her headache. The patient takes 100 mg Topamax at night. She is tolerating these medications well. She returns to this office for an evaluation. The patient indicates that heat exposure, perfumes, and certain foods such as chocolate and nuts may bring on headaches.  REVIEW OF SYSTEMS: Out of a complete 14 system review of symptoms, the patient complains only of the following symptoms, and all other reviewed systems are negative.  Constipation, aching muscles, headache   ALLERGIES: No Known Allergies  HOME MEDICATIONS: Outpatient Prescriptions Prior to  Visit  Medication Sig Dispense Refill  . amLODipine (NORVASC) 5 MG tablet TAKE ONE TABLET BY MOUTH EVERY DAY 30 tablet 0  . ibuprofen (ADVIL,MOTRIN) 800 MG tablet Take 1 tablet (800 mg total) by mouth once. 30 tablet 0  . methocarbamol (ROBAXIN) 500 MG tablet Take 1 tablet (500 mg total) by mouth 2 (two) times daily as needed. 14 tablet 0  . ondansetron (ZOFRAN) 4 MG tablet Take 1 tablet (4 mg total) by mouth every 8 (eight) hours as needed for nausea. 90 tablet 1  . oxyCODONE-acetaminophen (PERCOCET/ROXICET) 5-325 MG per tablet Take 1 tablet by mouth every 6 (six) hours as needed. Must last 28 days. 40 tablet 0  . PRESCRIPTION MEDICATION Inject 1 mL into the skin 3 (three) times a week. immunization therapy for allergies    . QSYMIA 11.25-69 MG CP24 Take 1 capsule by mouth daily.    Marland Kitchen topiramate (TOPAMAX) 50 MG tablet Take 3 tablets (150 mg total) by mouth at bedtime. 270 tablet 3   No facility-administered medications prior to visit.    PAST MEDICAL HISTORY: Past Medical History  Diagnosis Date  . Migraine   . Hypertension   . Obesity     PAST SURGICAL HISTORY: Past Surgical History  Procedure Laterality Date  . Cesarean section    . Cholecystectomy    . Endometrial ablation      FAMILY HISTORY: Family History  Problem Relation Age of Onset  . Breast cancer      1st degree relative< 50  . Hypertension    . Kidney disease Brother  dialysis passed away 38 yrs  . Breast cancer Mother   . Cancer Maternal Uncle     Breast cancer  . Cancer Maternal Grandmother     Breast cancer         PHYSICAL EXAM  Filed Vitals:   01/03/15 1426  BP: 110/81  Pulse: 96  Height: 5' 5"  (1.651 m)  Weight: 234 lb (106.142 kg)   Body mass index is 38.94 kg/(m^2).  Generalized: Well developed, in no acute distress   Neurological examination  Mentation: Alert oriented to time, place, history taking. Follows all commands speech and language fluent Cranial nerve II-XII: Pupils  were equal round reactive to light. Extraocular movements were full, visual field were full on confrontational test. Facial sensation and strength were normal. Uvula tongue midline. Head turning and shoulder shrug  were normal and symmetric. Motor: The motor testing reveals 5 over 5 strength of all 4 extremities. Good symmetric motor tone is noted throughout.  Sensory: Sensory testing is intact to soft touch on all 4 extremities. No evidence of extinction is noted.  Coordination: Cerebellar testing reveals good finger-nose-finger and heel-to-shin bilaterally.  Gait and station: Gait is normal. Tandem gait is normal. Romberg is negative. No drift is seen.  Reflexes: Deep tendon reflexes are symmetric and normal bilaterally.     DIAGNOSTIC DATA (LABS, IMAGING, TESTING) - I reviewed patient records, labs, notes, testing and imaging myself where available.   ASSESSMENT AND PLAN 44 y.o. year old female  has a past medical history of Migraine; Hypertension; and Obesity. here with:  1. Migraine headaches  Patient's headache frequency has improved, although she still has approximately 7 migraines a month and the severity of her migraines have gotten slightly worse. She will continue taking percocet for severe migraines. I will increase Topamax to 200 mg at bedtime. If her headache frequency increases she will let us know. Otherwise she will F/U in 6 months or sooner if needed.      Ward Givens, MSN, NP-C 01/03/2015, 2:41 PM [facility] [address] [place], [place] [address] [phone]  Note: This document was prepared with digital dictation and possible smart phrase technology. Any transcriptional errors that result from this process are unintentional.

## 2015-01-03 NOTE — Patient Instructions (Signed)
Increase Topamax to 4 tablets at bedtime (200 mg) Continue to use Percocet for severe migraines.  If your migraine frequency increases please let us know

## 2015-01-03 NOTE — Progress Notes (Signed)
I have read the note, and I agree with the clinical assessment and plan.  Wanda [name]   

## 2015-02-04 ENCOUNTER — Encounter (HOSPITAL_COMMUNITY): Payer: Self-pay | Admitting: *Deleted

## 2015-02-04 ENCOUNTER — Emergency Department (HOSPITAL_COMMUNITY)
Admission: EM | Admit: 2015-02-04 | Discharge: 2015-02-04 | Disposition: A | Discharge status: Home / Self Care | Payer: BC Managed Care – PPO | Attending: Emergency Medicine | Admitting: Emergency Medicine

## 2015-02-04 ENCOUNTER — Emergency Department (HOSPITAL_COMMUNITY): Payer: BC Managed Care – PPO

## 2015-02-04 DIAGNOSIS — Z79899 Other long term (current) drug therapy: Secondary | ICD-10-CM | POA: Diagnosis not present

## 2015-02-04 DIAGNOSIS — G43909 Migraine, unspecified, not intractable, without status migrainosus: Secondary | ICD-10-CM | POA: Insufficient documentation

## 2015-02-04 DIAGNOSIS — E669 Obesity, unspecified: Secondary | ICD-10-CM | POA: Insufficient documentation

## 2015-02-04 DIAGNOSIS — M542 Cervicalgia: Secondary | ICD-10-CM | POA: Insufficient documentation

## 2015-02-04 DIAGNOSIS — I10 Essential (primary) hypertension: Secondary | ICD-10-CM | POA: Diagnosis not present

## 2015-02-04 DIAGNOSIS — M79622 Pain in left upper arm: Secondary | ICD-10-CM | POA: Insufficient documentation

## 2015-02-04 DIAGNOSIS — M79602 Pain in left arm: Secondary | ICD-10-CM

## 2015-02-04 MED ORDER — IBUPROFEN 800 MG PO TABS: 800.0000 mg | ORAL_TABLET | Freq: Three times a day (TID) | ORAL | Status: DC | PRN

## 2015-02-04 MED ORDER — METHOCARBAMOL 500 MG PO TABS: 500.0000 mg | ORAL_TABLET | Freq: Four times a day (QID) | ORAL | Status: DC | PRN

## 2015-02-04 MED ORDER — HYDROMORPHONE HCL 1 MG/ML IJ SOLN
INTRAMUSCULAR | Status: AC
  Filled 2015-02-04: qty 1

## 2015-02-04 MED ORDER — PREDNISONE 50 MG PO TABS: ORAL_TABLET | ORAL | Status: DC

## 2015-02-04 NOTE — ED Provider Notes (Signed)
CSN: [id number]     Arrival date & time 02/04/15  1211 History   This chart was scribed for non-physician practitioner Clayton Bibles PA-C working with Tanna Furry, MD by Lora Havens, ED Scribe. This patient was seen in [facility] and the patient's care was started at 12:51 PM.     Chief Complaint  Patient presents with  . Arm Pain   Patient is a 44 y.o. female presenting with arm pain. The history is provided by the patient. No language interpreter was used.  Arm Pain Pertinent negatives include no chest pain, no abdominal pain, no headaches and no shortness of breath.    HPI Comments: Wanda [name] is a 44 y.o. female who presents to the Emergency Department complaining of intermittent gradually worsening left arm pain ongoing for 2 months. Her pain usually last for 10-15 min. On Wednesday the pain returned but was worse than usual described as someone "wringing her arm and it hurts deep down to the bone. Afterward her arm went numb. Pt also complains of neck pain, onset today. She denies trauma to her left arm or neck. Movement does not worsens the pain.  Denies any exacerbating or palliative symptoms.  Denies exertional pain. Oxycodone which she takes for migraines does not provide relief. 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 She denies weakness  fever, SOB, CP, chills and leg swelling  Past Medical History  Diagnosis Date  . Migraine   . Hypertension   . Obesity    Past Surgical History  Procedure Laterality Date  . Cesarean section    . Cholecystectomy    . Endometrial ablation     Family History  Problem Relation Age of Onset  . Breast cancer      1st degree relative< 50  . Hypertension    . Kidney disease Brother     dialysis passed away 31 yrs  . Breast cancer Mother   . Cancer Maternal Uncle     Breast cancer  . Cancer Maternal Grandmother     Breast cancer   History  Substance Use Topics  . Smoking status: Never Smoker   . Smokeless tobacco: Never Used   . Alcohol Use: Yes     Comment: wine occassionally   OB History    No data available     Review of Systems  Constitutional: Negative for fever and chills.  Eyes: Negative for visual disturbance.  Respiratory: Negative for shortness of breath and wheezing.   Cardiovascular: Negative for chest pain and leg swelling.  Gastrointestinal: Negative for nausea, vomiting, abdominal pain and diarrhea.  Musculoskeletal: Positive for neck stiffness. Negative for myalgias and arthralgias.  Neurological: Negative for dizziness, weakness, numbness and headaches.    Allergies  Review of patient's allergies indicates no known allergies.  Home Medications   Prior to Admission medications   Medication Sig Start Date End Date Taking? Authorizing Provider  amLODipine (NORVASC) 5 MG tablet TAKE ONE TABLET BY MOUTH EVERY DAY 07/17/11  Yes Yvonne R Lowne, DO  mometasone (NASONEX) 50 MCG/ACT nasal spray Place 1 spray into the nose daily as needed (allergies).   Yes Historical Provider, MD  ondansetron (ZOFRAN) 4 MG tablet Take 1 tablet (4 mg total) by mouth every 8 (eight) hours as needed for nausea. 06/30/14  Yes Kathrynn Ducking, MD  oxyCODONE-acetaminophen (PERCOCET/ROXICET) 5-325 MG per tablet Take 1 tablet by mouth every 6 (six) hours as needed. Must last 28 days. Patient taking differently: Take 1 tablet by  mouth every 6 (six) hours as needed for moderate pain. Must last 28 days. 01/03/15  Yes Ward Givens, NP  topiramate (TOPAMAX) 50 MG tablet Take 4 tablets (200 mg total) by mouth at bedtime. 01/03/15  Yes Ward Givens, NP  ibuprofen (ADVIL,MOTRIN) 800 MG tablet Take 1 tablet (800 mg total) by mouth once. Patient not taking: Reported on 02/04/2015 02/08/13   Linus Mako, PA-C  methocarbamol (ROBAXIN) 500 MG tablet Take 1 tablet (500 mg total) by mouth 2 (two) times daily as needed. Patient not taking: Reported on 02/04/2015 07/15/13   Larene Pickett, PA-C   BP 127/90 mmHg  Pulse 85  Temp(Src) 98.2  F (36.8 C) (Oral)  Resp 16  SpO2 100% Physical Exam  Constitutional: She appears well-developed and well-nourished. No distress.  HENT:  Head: Normocephalic and atraumatic.  Neck: Neck supple.  Cardiovascular: Normal rate, regular rhythm and normal heart sounds.   No murmur heard. Pulmonary/Chest: Effort normal and breath sounds normal. No respiratory distress. She has no wheezes. She has no rales. She exhibits no tenderness.  Musculoskeletal: Normal range of motion. She exhibits no edema or tenderness.       Arms: Spine nontender, no crepitus, or stepoffs. Upper extremities: Strength 5/5, sensation intact, distal pulses intact.  Neurological: She is alert.  Skin: She is not diaphoretic.  Nursing note and vitals reviewed.   ED Course  Procedures  DIAGNOSTIC STUDIES: Oxygen Saturation is 100% on room air, normal by my interpretation.    COORDINATION OF CARE: 12:58 PM Discussed treatment plan with pt at bedside and pt agreed to plan.  Labs Review Labs Reviewed - No data to display  Imaging Review Dg Cervical Spine Complete  02/04/2015   CLINICAL DATA:  LEFT-sided neck pain. Anterior humeral pain. Symptoms for 2 months. Initial encounter.  EXAM: CERVICAL SPINE  4+ VIEWS  COMPARISON:  07/15/2013.  FINDINGS: Reversal of the normal cervical lordosis centered around C5. The prevertebral soft tissues are normal. There is no cervical spine fracture. Unchanged lucency in the C4 vertebral body is probably projectional given the stability compared to 2014. Mild dextroconvex torticollis on the frontal view. Odontoid is intact. Cervicothoracic junction appears within normal limits. LEFT foramina not evaluated due to obliquity. No bony RIGHT foraminal stenosis.  Degenerative disc disease is mild, with disc space narrowing and marginal osteophytes at C5-C6 and C6-C7.  IMPRESSION: No acute osseous abnormality.  Mild lower cervical spondylosis.   Electronically Signed   By: Dereck Ligas M.D.   On:  02/04/2015 14:28   Dg Humerus Left  02/04/2015   CLINICAL DATA:  Left arm pain for 2 months. No known injury. Initial encounter.  EXAM: LEFT HUMERUS - 2+ VIEW  COMPARISON:  None.  FINDINGS: There is no evidence of fracture or other focal bone lesions. Soft tissues are unremarkable.  IMPRESSION: Negative.   Electronically Signed   By: Margarette Canada M.D.   On: 02/04/2015 14:23     EKG Interpretation None        Date: 02/04/2015  Rate: 79  Rhythm: normal sinus rhythm  QRS Axis: normal  Intervals: normal  ST/T Wave abnormalities: nonspecific T wave changes  Conduction Disutrbances:none  Narrative Interpretation:   Old EKG Reviewed: none available    MDM   Final diagnoses:  Left arm pain   Afebrile, nontoxic patient with intermittent left upper arm pain that is random without exacerbating or palliative symptoms, no trauma.  This may be radicular pain as pt is beginning to  have some left sided neck pain as well.  Neurovascularly intact.  Xrays negative.  EKG nonischemic.  She is having no CP or SOB and the symptoms are not exertional.  Doubt ACS or chest pathology causing symptoms.   D/C home with ibuprofen, robaxin, prednisone, PCP follow up.  Discussed result, findings, treatment, and follow up  with patient.  Pt given return precautions.  Pt verbalizes understanding and agrees with plan.       I personally performed the services described in this documentation, which was scribed in my presence. The recorded information has been reviewed and is accurate.      Clayton Bibles, PA-C 02/04/15 [place], MD 02/12/15 [phone]

## 2015-02-04 NOTE — ED Notes (Signed)
Pt reports left upper arm pain x2 months, denies pain at present, but says her arm will hurt later today. Reports arm really hurt on Wednesday.

## 2015-02-04 NOTE — ED Notes (Signed)
Xrays taking a long time to result.

## 2015-02-04 NOTE — Discharge Instructions (Signed)
Read the information below.  Use the prescribed medication as directed.  Please discuss all new medications with your pharmacist.  You may return to the Emergency Department at any time for worsening condition or any new symptoms that concern you.    If there is any possibility that you might be pregnant, please let your health care provider know and discuss this with the pharmacist to ensure medication safety.   If you develop uncontrolled pain, weakness or numbness of the extremity, severe discoloration of the skin, or you are unable to use your arm, return to the ER for a recheck.      Musculoskeletal Pain Musculoskeletal pain is muscle and boney aches and pains. These pains can occur in any part of the body. Your caregiver may treat you without knowing the cause of the pain. They may treat you if blood or urine tests, X-rays, and other tests were normal.  CAUSES There is often not a definite cause or reason for these pains. These pains may be caused by a type of germ (virus). The discomfort may also come from overuse. Overuse includes working out too hard when your body is not fit. Boney aches also come from weather changes. Bone is sensitive to atmospheric pressure changes. HOME CARE INSTRUCTIONS   Ask when your test results will be ready. Make sure you get your test results.  Only take over-the-counter or prescription medicines for pain, discomfort, or fever as directed by your caregiver. If you were given medications for your condition, do not drive, operate machinery or power tools, or sign legal documents for 24 hours. Do not drink alcohol. Do not take sleeping pills or other medications that may interfere with treatment.  Continue all activities unless the activities cause more pain. When the pain lessens, slowly resume normal activities. Gradually increase the intensity and duration of the activities or exercise.  During periods of severe pain, bed rest may be helpful. Lay or sit in any  position that is comfortable.  Putting ice on the injured area.  Put ice in a bag.  Place a towel between your skin and the bag.  Leave the ice on for 15 to 20 minutes, 3 to 4 times a day.  Follow up with your caregiver for continued problems and no reason can be found for the pain. If the pain becomes worse or does not go away, it may be necessary to repeat tests or do additional testing. Your caregiver may need to look further for a possible cause. SEEK IMMEDIATE MEDICAL CARE IF:  You have pain that is getting worse and is not relieved by medications.  You develop chest pain that is associated with shortness or breath, sweating, feeling sick to your stomach (nauseous), or throw up (vomit).  Your pain becomes localized to the abdomen.  You develop any new symptoms that seem different or that concern you. MAKE SURE YOU:   Understand these instructions.  Will watch your condition.  Will get help right away if you are not doing well or get worse. Document Released: 11/17/2005 Document Revised: 02/09/2012 Document Reviewed: 07/22/2013 [facility] Patient Information 2015 [place], [place]. This information is not intended to replace advice given to you by your health care provider. Make sure you discuss any questions you have with your health care provider.  Neuropathic Pain We often think that pain has a physical cause. If we get rid of the cause, the pain should go away. Nerves themselves can also cause pain. It is called neuropathic  pain, which means nerve abnormality. It may be difficult for the patients who have it and for the treating caregivers. Pain is usually described as acute (short-lived) or chronic (long-lasting). Acute pain is related to the physical sensations caused by an injury. It can last from a few seconds to many weeks, but it usually goes away when normal healing occurs. Chronic pain lasts beyond the typical healing time. With neuropathic pain, the nerve fibers themselves  may be damaged or injured. They then send incorrect signals to other pain centers. The pain you feel is real, but the cause is not easy to find.  CAUSES  Chronic pain can result from diseases, such as diabetes and shingles (an infection related to chickenpox), or from trauma, surgery, or amputation. It can also happen without any known injury or disease. The nerves are sending pain messages, even though there is no identifiable cause for such messages.   Other common causes of neuropathy include diabetes, phantom limb pain, or Regional Pain Syndrome (RPS).  As with all forms of chronic back pain, if neuropathy is not correctly treated, there can be a number of associated problems that lead to a downward cycle for the patient. These include depression, sleeplessness, feelings of fear and anxiety, limited social interaction and inability to do normal daily activities or work.  The most dramatic and mysterious example of neuropathic pain is called "phantom limb syndrome." This occurs when an arm or a leg has been removed because of illness or injury. The brain still gets pain messages from the nerves that originally carried impulses from the missing limb. These nerves now seem to misfire and cause troubling pain.  Neuropathic pain often seems to have no cause. It responds poorly to standard pain treatment. Neuropathic pain can occur after:  Shingles (herpes zoster virus infection).  A lasting burning sensation of the skin, caused usually by injury to a peripheral nerve.  Peripheral neuropathy which is widespread nerve damage, often caused by diabetes or alcoholism.  Phantom limb pain following an amputation.  Facial nerve problems (trigeminal neuralgia).  Multiple sclerosis.  Reflex sympathetic dystrophy.  Pain which comes with cancer and cancer chemotherapy.  Entrapment neuropathy such as when pressure is put on a nerve such as in carpal tunnel syndrome.  Back, leg, and hip problems  (sciatica).  Spine or back surgery.  HIV Infection or AIDS where nerves are infected by viruses. Your caregiver can explain items in the above list which may apply to you. SYMPTOMS  Characteristics of neuropathic pain are:  Severe, sharp, electric shock-like, shooting, lightening-like, knife-like.  Pins and needles sensation.  Deep burning, deep cold, or deep ache.  Persistent numbness, tingling, or weakness.  Pain resulting from light touch or other stimulus that would not usually cause pain.  Increased sensitivity to something that would normally cause pain, such as a pinprick. Pain may persist for months or years following the healing of damaged tissues. When this happens, pain signals no longer sound an alarm about current injuries or injuries about to happen. Instead, the alarm system itself is not working correctly.  Neuropathic pain may get worse instead of better over time. For some people, it can lead to serious disability. It is important to be aware that severe injury in a limb can occur without a proper, protective pain response.Burns, cuts, and other injuries may go unnoticed. Without proper treatment, these injuries can become infected or lead to further disability. Take any injury seriously, and consult your caregiver for treatment. DIAGNOSIS  When you have a pain with no known cause, your caregiver will probably ask some specific questions:   Do you have any other conditions, such as diabetes, shingles, multiple sclerosis, or HIV infection?  How would you describe your pain? (Neuropathic pain is often described as shooting, stabbing, burning, or searing.)  Is your pain worse at any time of the day? (Neuropathic pain is usually worse at night.)  Does the pain seem to follow a certain physical pathway?  Does the pain come from an area that has missing or injured nerves? (An example would be phantom limb pain.)  Is the pain triggered by minor things such as rubbing  against the sheets at night? These questions often help define the type of pain involved. Once your caregiver knows what is happening, treatment can begin. Anticonvulsant, antidepressant drugs, and various pain relievers seem to work in some cases. If another condition, such as diabetes is involved, better management of that disorder may relieve the neuropathic pain.  TREATMENT  Neuropathic pain is frequently long-lasting and tends not to respond to treatment with narcotic type pain medication. It may respond well to other drugs such as antiseizure and antidepressant medications. Usually, neuropathic problems do not completely go away, but partial improvement is often possible with proper treatment. Your caregivers have large numbers of medications available to treat you. Do not be discouraged if you do not get immediate relief. Sometimes different medications or a combination of medications will be tried before you receive the results you are hoping for. See your caregiver if you have pain that seems to be coming from nowhere and does not go away. Help is available.  SEEK IMMEDIATE MEDICAL CARE IF:   There is a sudden change in the quality of your pain, especially if the change is on only one side of the body.  You notice changes of the skin, such as redness, black or purple discoloration, swelling, or an ulcer.  You cannot move the affected limbs. Document Released: 08/14/2004 Document Revised: 02/09/2012 Document Reviewed: 08/14/2004 [facility] Patient Information 2015 [place], [place]. This information is not intended to replace advice given to you by your health care provider. Make sure you discuss any questions you have with your health care provider.

## 2015-05-14 ENCOUNTER — Other Ambulatory Visit: Payer: Self-pay | Admitting: Nurse Practitioner

## 2015-05-14 DIAGNOSIS — R52 Pain, unspecified: Secondary | ICD-10-CM

## 2015-05-16 ENCOUNTER — Ambulatory Visit
Admission: RE | Admit: 2015-05-16 | Discharge: 2015-05-16 | Disposition: A | Discharge status: Home / Self Care | Payer: BC Managed Care – PPO | Source: Ambulatory Visit | Attending: Nurse Practitioner | Admitting: Nurse Practitioner

## 2015-05-16 DIAGNOSIS — R52 Pain, unspecified: Secondary | ICD-10-CM

## 2015-06-29 ENCOUNTER — Other Ambulatory Visit: Payer: Self-pay | Admitting: Adult Health

## 2015-07-04 ENCOUNTER — Ambulatory Visit (INDEPENDENT_AMBULATORY_CARE_PROVIDER_SITE_OTHER): Payer: BC Managed Care – PPO | Admitting: Adult Health

## 2015-07-04 ENCOUNTER — Encounter: Payer: Self-pay | Admitting: Adult Health

## 2015-07-04 VITALS — BP 106/79 | HR 83 | Ht 63.0 in | Wt 240.0 lb

## 2015-07-04 DIAGNOSIS — G43009 Migraine without aura, not intractable, without status migrainosus: Secondary | ICD-10-CM

## 2015-07-04 MED ORDER — TOPIRAMATE 100 MG PO TABS: 200.0000 mg | ORAL_TABLET | Freq: Every day | ORAL | Status: DC

## 2015-07-04 NOTE — Progress Notes (Signed)
I have read the note, and I agree with the clinical assessment and plan.  WILLIS,CHARLES KEITH   

## 2015-07-04 NOTE — Progress Notes (Signed)
PATIENT: Wanda [name] DOB: July 05, 1971  REASON FOR VISIT: follow up-migraines HISTORY FROM: patient  HISTORY OF PRESENT ILLNESS: Wanda [name] is a 44 year old female with a history of migraine headaches. She returns today for follow-up. She is currently taking Topamax 200 mg at bedtime and that was working well for her. She states in July she had 11 headache days. However before that she only had 5 headache days in June. She states that her headaches normally occur in the left temporal area she confirms nausea but denies vomiting. She confirms photophobia and phonophobia. For severe headache she will take Percocet. She states that this does not resolve her headache but it does take the edge off. In the past she has tried Imitrex and Maxalt without any benefit. She returns today for an evaluation.  HISTORY 01/03/15: Wanda [name] is a 44 year old female with a history of migraines. She returns today for follow-up. She is currently taking Topamax 150 mg at bedtime. She states that her headaches have improved. She has approximately 7 headaches per month. She will have nausea with her migraines and will use Zofran. confirms photophobia Denies phonophobia. For severe migraines she will use Percocet and states that they works well. Her headaches are normally located on the left temporal region. She states that the last couple of migraines have been trigger by smells. 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   HISTORY 06/30/14 (WILLIS): Wanda [name] is a 44 year old right-handed black female with a history of migraine headaches. The patient has had frequent headaches, apparently she ran out of her medications recently, and began having 15 or 16 headaches a month. She now is back on Topamax, and her headache frequency has been cut in half. The patient works, and she tries not to miss work because of the headaches, but she will have some nausea at  times. The patient has Zofran to take if needed, and in the past she has responded better to oxycodone and hydrocodone for her headache. The patient takes 100 mg Topamax at night. She is tolerating these medications well. She returns to this office for an evaluation. The patient indicates that heat exposure, perfumes, and certain foods such as chocolate and nuts may bring on headaches.  REVIEW OF SYSTEMS: Out of a complete 14 system review of symptoms, the patient complains only of the following symptoms, and all other reviewed systems are negative.  Constipation, daytime sleepiness, snoring  ALLERGIES: No Known Allergies  HOME MEDICATIONS: Outpatient Prescriptions Prior to Visit  Medication Sig Dispense Refill  . amLODipine (NORVASC) 5 MG tablet TAKE ONE TABLET BY MOUTH EVERY DAY 30 tablet 0  . ibuprofen (ADVIL,MOTRIN) 800 MG tablet Take 1 tablet (800 mg total) by mouth every 8 (eight) hours as needed for mild pain or moderate pain. 15 tablet 0  . methocarbamol (ROBAXIN) 500 MG tablet Take 1 tablet (500 mg total) by mouth every 6 (six) hours as needed for muscle spasms (or pain). 15 tablet 0  . oxyCODONE-acetaminophen (PERCOCET/ROXICET) 5-325 MG per tablet Take 1 tablet by mouth every 6 (six) hours as needed. Must last 28 days. (Patient taking differently: Take 1 tablet by mouth every 6 (six) hours as needed for moderate pain. Must last 28 days.) 40 tablet 0  . topiramate (TOPAMAX) 50 MG tablet TAKE 4 TABLETS (200 MG TOTAL) BY MOUTH AT BEDTIME. 120 tablet 0  . mometasone (NASONEX) 50 MCG/ACT nasal spray  Place 1 spray into the nose daily as needed (allergies).    . ondansetron (ZOFRAN) 4 MG tablet Take 1 tablet (4 mg total) by mouth every 8 (eight) hours as needed for nausea. (Patient not taking: Reported on 07/04/2015) 90 tablet 1  . predniSONE (DELTASONE) 50 MG tablet Take 1 tab PO daily (Patient not taking: Reported on 07/04/2015) 5 tablet 0   No facility-administered medications prior to visit.      PAST MEDICAL HISTORY: Past Medical History  Diagnosis Date  . Migraine   . Hypertension   . Obesity     PAST SURGICAL HISTORY: Past Surgical History  Procedure Laterality Date  . Cesarean section    . Cholecystectomy    . Endometrial ablation      FAMILY HISTORY: Family History  Problem Relation Age of Onset  . Breast cancer      1st degree relative< 50  . Hypertension    . Kidney disease Brother     dialysis passed away 44 yrs  . Breast cancer Mother   . Cancer Maternal Uncle     Breast cancer  . Cancer Maternal Grandmother     Breast cancer    SOCIAL HISTORY: History   Social History  . Marital Status: Married    Spouse Name: N/A  . Number of Children: 3  . Years of Education: BA   Occupational History  . [employer]   . ACCOUNTANT   .  [place]   Social History Main Topics  . Smoking status: Never Smoker   . Smokeless tobacco: Never Used  . Alcohol Use: Yes     Comment: wine occassionally  . Drug Use: No  . Sexual Activity: Not on file   Other Topics Concern  . Not on file   Social History Narrative      PHYSICAL EXAM  There were no vitals filed for this visit. There is no weight on file to calculate BMI.  Generalized: Well developed, in no acute distress   Neurological examination  Mentation: Alert oriented to time, place, history taking. Follows all commands speech and language fluent Cranial nerve II-XII: Pupils were equal round reactive to light. Extraocular movements were full, visual field were full on confrontational test. Facial sensation and strength were normal. Uvula tongue midline. Head turning and shoulder shrug  were normal and symmetric. Motor: The motor testing reveals 5 over 5 strength of all 4 extremities. Good symmetric motor tone is noted throughout.  Sensory: Sensory testing is intact to soft touch on all 4 extremities. No evidence of extinction is noted.  Coordination: Cerebellar testing reveals good  finger-nose-finger and heel-to-shin bilaterally.  Gait and station: Gait is normal. Tandem gait is normal. Romberg is negative. No drift is seen.  Reflexes: Deep tendon reflexes are symmetric and normal bilaterally.   DIAGNOSTIC DATA (LABS, IMAGING, TESTING) - I reviewed patient records, labs, notes, testing and imaging myself where available.  Lab Results  Component Value Date   WBC 10.5 12/22/2012   HGB 14.2 12/22/2012   HCT 41.6 12/22/2012   MCV 81.3 12/22/2012   PLT 304 12/22/2012      Component Value Date/Time   NA 136 12/22/2012 1854   K 3.7 12/22/2012 1854   CL 103 12/22/2012 1854   CO2 21 12/22/2012 1854   GLUCOSE 128* 12/22/2012 1854   BUN 6 12/22/2012 1854   CREATININE 1.01 12/22/2012 1854   CALCIUM 8.8 12/22/2012 1854   PROT 7.5 12/22/2012 1854  ALBUMIN 3.6 12/22/2012 1854   AST 19 12/22/2012 1854   ALT 12 12/22/2012 1854   ALKPHOS 65 12/22/2012 1854   BILITOT 0.4 12/22/2012 1854   GFRNONAA 69* 12/22/2012 1854   GFRAA 80* 12/22/2012 1854   Lab Results  Component Value Date   CHOL 142 02/06/2010   HDL 48.10 02/06/2010   LDLCALC 82 02/06/2010   TRIG 61.0 02/06/2010   CHOLHDL 3 02/06/2010   Lab Results  Component Value Date   HGBA1C 6.3 11/18/2010   No results found for: [id number] Lab Results  Component Value Date   TSH 1.00 08/22/2009      ASSESSMENT AND PLAN 44 y.o. year old female  has a past medical history of Migraine; Hypertension; and Obesity. here with:  1. Migraine headaches  In the last month her headaches have gotten more frequent. However before that the Topamax was working well for the patient. For now the patient will continue taking Topamax 200 mg at bedtime. If her headache frequency does not improve in the next month we will have to consider another medication such as nortriptyline. The patient is amenable to this. I have also informed the patient that if she has a headache that does not improve in severity after 24 hours she  should let us know. Patient verbalized understanding. She will follow-up in 3-4 months or sooner if needed.   Ward Givens, MSN, NP-C 07/04/2015, 4:01 PM [facility] [address], [place], [place] [address] [phone]  Note: This document was prepared with digital dictation and possible smart phrase technology. Any transcriptional errors that result from this process are unintentional.

## 2015-07-04 NOTE — Patient Instructions (Signed)
Continue Topamax If headache frequency does not improve this month please let us know.  Call if you have an acute headache that does not resolve in 24 hours.

## 2015-07-09 ENCOUNTER — Other Ambulatory Visit: Payer: Self-pay | Admitting: Obstetrics and Gynecology

## 2015-07-09 ENCOUNTER — Ambulatory Visit: Payer: BC Managed Care – PPO | Admitting: Adult Health

## 2015-07-09 ENCOUNTER — Telehealth: Payer: Self-pay | Admitting: Adult Health

## 2015-07-09 ENCOUNTER — Other Ambulatory Visit: Payer: Self-pay | Admitting: Adult Health

## 2015-07-09 MED ORDER — PREDNISONE 5 MG PO TABS: ORAL_TABLET | ORAL | Status: DC

## 2015-07-09 NOTE — Telephone Encounter (Signed)
Patient called stating she has had a migraine since Saturday. Megan had instructed  her she could call to get medication called in. Please call and advise. Patient can be reached at [phone]. Pharmacy is [facility]

## 2015-07-09 NOTE — Telephone Encounter (Signed)
Called patient and spoke with her patient stated that she missed worked because of her migraine her pain level is a nine.

## 2015-07-09 NOTE — Telephone Encounter (Signed)
I called the patient. She has had a headache since Saturday. She states that her headache is located in the right temporal area and above the right eye. She denies photophobia or phonophobia. She does have some nausea but she feels that this is related to the amount of Excedrin migraine she has taken. She states that she's not gotten any benefit from medication. She has taken prednisone in the past (not for migraine) and has tolerated it well. I will order the patient a prednisone Dosepak. She will let me know if this is not beneficial.

## 2015-07-10 LAB — CYTOLOGY - PAP

## 2015-08-16 ENCOUNTER — Telehealth: Payer: Self-pay | Admitting: Adult Health

## 2015-08-16 MED ORDER — PREDNISONE 5 MG PO TABS: ORAL_TABLET | ORAL | Status: DC

## 2015-08-16 NOTE — Telephone Encounter (Signed)
Patient has had a migraine for the last two days. Patient migraine is at 8 . Patient is wanting a prednisone called in.

## 2015-08-16 NOTE — Telephone Encounter (Signed)
I called the patient. She states she  had a headache that started yesterday. She states that her headache does improve when she takes Advil however once she lies down and wakes back up her headache returns. She has tried prednisone in the past and its worked great for her according to the patient. She is also been on Percocet in the past she states that it doesn't resolve her headaches but it does help take the edge off. I consulted with Dr. Krista Blue. I will prescribe patient 5 day course of steroids. She did receive this last month. I informed the patient that we will not do steroids every month for her migraines. We will get her scheduled for a revisit to adjust her preventative medications. Patient verbalized understanding.

## 2015-08-16 NOTE — Telephone Encounter (Signed)
Patient called with migraine x 2 days. She said last OV Jinny Blossom said if she got migraine to call for steroid medication. Please call and advise. Patient can be reached at [phone]

## 2015-09-03 ENCOUNTER — Ambulatory Visit: Payer: Self-pay | Admitting: Adult Health

## 2015-10-04 ENCOUNTER — Encounter: Payer: Self-pay | Admitting: Adult Health

## 2015-10-04 ENCOUNTER — Ambulatory Visit (INDEPENDENT_AMBULATORY_CARE_PROVIDER_SITE_OTHER): Payer: BC Managed Care – PPO | Admitting: Adult Health

## 2015-10-04 ENCOUNTER — Ambulatory Visit: Payer: BC Managed Care – PPO | Admitting: Adult Health

## 2015-10-04 VITALS — BP 106/78 | HR 80 | Ht 63.0 in | Wt 251.5 lb

## 2015-10-04 DIAGNOSIS — G43019 Migraine without aura, intractable, without status migrainosus: Secondary | ICD-10-CM

## 2015-10-04 DIAGNOSIS — R0681 Apnea, not elsewhere classified: Secondary | ICD-10-CM | POA: Diagnosis not present

## 2015-10-04 DIAGNOSIS — G4719 Other hypersomnia: Secondary | ICD-10-CM

## 2015-10-04 DIAGNOSIS — R0683 Snoring: Secondary | ICD-10-CM | POA: Diagnosis not present

## 2015-10-04 MED ORDER — NORTRIPTYLINE HCL 10 MG PO CAPS: 10.0000 mg | ORAL_CAPSULE | Freq: Every day | ORAL | Status: DC

## 2015-10-04 NOTE — Patient Instructions (Addendum)
Continue topamax 1 tablet twice a day Begin Nortriptyline 10 mg at bedtime.  Topamax and Nortriptyline can cause central nervous system depression. After two weeks if headaches have improved we will wean off Topamax I will refer for sleep study. If your symptoms worsen or you develop new symptoms please let us know.   Nortriptyline capsules What is this medicine? NORTRIPTYLINE (nor TRIP ti leen) is used to treat depression. This medicine may be used for other purposes; ask your health care provider or pharmacist if you have questions. What should I tell my health care provider before I take this medicine? They need to know if you have any of these conditions: -an alcohol problem -bipolar disorder or schizophrenia -difficulty passing urine, prostate trouble -glaucoma -heart disease or recent heart attack -liver disease -over active thyroid -seizures -thoughts or plans of suicide or a previous suicide attempt or family history of suicide attempt -an unusual or allergic reaction to nortriptyline, other medicines, foods, dyes, or preservatives -pregnant or trying to get pregnant -breast-feeding How should I use this medicine? Take this medicine by mouth with a glass of water. Follow the directions on the prescription label. Take your doses at regular intervals. Do not take it more often than directed. Do not stop taking this medicine suddenly except upon the advice of your doctor. Stopping this medicine too quickly may cause serious side effects or your condition may worsen. A special MedGuide will be given to you by the pharmacist with each prescription and refill. Be sure to read this information carefully each time. Talk to your pediatrician regarding the use of this medicine in children. Special care may be needed. Overdosage: If you think you have taken too much of this medicine contact a poison control center or emergency room at once. NOTE: This medicine is only for you. Do not share  this medicine with others. What if I miss a dose? If you miss a dose, take it as soon as you can. If it is almost time for your next dose, take only that dose. Do not take double or extra doses. What may interact with this medicine? Do not take this medicine with any of the following medications: -arsenic trioxide -certain medicines medicines for irregular heart beat -cisapride -halofantrine -linezolid -MAOIs like Carbex, Eldepryl, Marplan, Nardil, and Parnate -methylene blue (injected into a vein) -other medicines for mental depression -phenothiazines like perphenazine, thioridazine and chlorpromazine -pimozide -probucol -procarbazine -sparfloxacin -St. John's Wort -ziprasidone This medicine may also interact with any of the following medications: -atropine and related drugs like hyoscyamine, scopolamine, tolterodine and others -barbiturate medicines for inducing sleep or treating seizures, such as phenobarbital -cimetidine -medicines for diabetes -medicines for seizures like carbamazepine or phenytoin -reserpine -thyroid medicine This list may not describe all possible interactions. Give your health care provider a list of all the medicines, herbs, non-prescription drugs, or dietary supplements you use. Also tell them if you smoke, drink alcohol, or use illegal drugs. Some items may interact with your medicine. What should I watch for while using this medicine? Tell your doctor if your symptoms do not get better or if they get worse. Visit your doctor or health care professional for regular checks on your progress. Because it may take several weeks to see the full effects of this medicine, it is important to continue your treatment as prescribed by your doctor. Patients and their families should watch out for new or worsening thoughts of suicide or depression. Also watch out for sudden changes in feelings  such as feeling anxious, agitated, panicky, irritable, hostile, aggressive,  impulsive, severely restless, overly excited and hyperactive, or not being able to sleep. If this happens, especially at the beginning of treatment or after a change in dose, call your health care professional. Dennis Bast may get drowsy or dizzy. Do not drive, use machinery, or do anything that needs mental alertness until you know how this medicine affects you. Do not stand or sit up quickly, especially if you are an older patient. This reduces the risk of dizzy or fainting spells. Alcohol may interfere with the effect of this medicine. Avoid alcoholic drinks. Do not treat yourself for coughs, colds, or allergies without asking your doctor or health care professional for advice. Some ingredients can increase possible side effects. Your mouth may get dry. Chewing sugarless gum or sucking hard candy, and drinking plenty of water may help. Contact your doctor if the problem does not go away or is severe. This medicine may cause dry eyes and blurred vision. If you wear contact lenses you may feel some discomfort. Lubricating drops may help. See your eye doctor if the problem does not go away or is severe. This medicine can cause constipation. Try to have a bowel movement at least every 2 to 3 days. If you do not have a bowel movement for 3 days, call your doctor or health care professional. This medicine can make you more sensitive to the sun. Keep out of the sun. If you cannot avoid being in the sun, wear protective clothing and use sunscreen. Do not use sun lamps or tanning beds/booths. What side effects may I notice from receiving this medicine? Side effects that you should report to your doctor or health care professional as soon as possible: -allergic reactions like skin rash, itching or hives, swelling of the face, lips, or tongue -abnormal production of milk in females -breast enlargement in both males and females -breathing problems -confusion, hallucinations -fever with increased sweating -irregular or  fast, pounding heartbeat -muscle stiffness, or spasms -pain or difficulty passing urine, loss of bladder control -seizures -suicidal thoughts or other mood changes -swelling of the testicles -tingling, pain, or numbness in the feet or hands -yellowing of the eyes or skin Side effects that usually do not require medical attention (report to your doctor or health care professional if they continue or are bothersome): -change in sex drive or performance -diarrhea -nausea, vomiting -weight gain or loss This list may not describe all possible side effects. Call your doctor for medical advice about side effects. You may report side effects to FDA at 1-800-FDA-1088. Where should I keep my medicine? Keep out of the reach of children. Store at room temperature between 15 and 30 degrees C (59 and 86 degrees F). Keep container tightly closed. Throw away any unused medicine after the expiration date. NOTE: This sheet is a summary. It may not cover all possible information. If you have questions about this medicine, talk to your doctor, pharmacist, or health care provider.    2016, Elsevier/Gold Standard. (2012-04-05 13:57:12)

## 2015-10-04 NOTE — Progress Notes (Signed)
PATIENT: Wanda [name] DOB: 21-Nov-1971  REASON FOR VISIT: follow up- migraine headaches HISTORY FROM: patient  HISTORY OF PRESENT ILLNESS: Wanda [name] is a 44 year old female with a history of migraine headaches. She returns today for follow-up. She continues to take Topamax 200 mg at bedtime. She states that in the last 2 months her headache frequency has not gotten any better. She states that she has approximately 7-8 migraines each month. Her migraines are normally located in the left temporal region. She confirms photophobia and phonophobia but denies nausea and vomiting. At this time the patient would like to try another medication to help with her headaches. She also confirms that she snores and her daughter has witnessed apnea events. She also states that she is sleepy and fatigued during the day and most often will wake up with a headache. She denies ever having a sleep study. She returns today for an evaluation.  HISTORY 07/04/15: Wanda [name] is a 44 year old female with a history of migraine headaches. She returns today for follow-up. She is currently taking Topamax 200 mg at bedtime and that was working well for her. She states in July she had 11 headache days. However before that she only had 5 headache days in June. She states that her headaches normally occur in the left temporal area she confirms nausea but denies vomiting. She confirms photophobia and phonophobia. For severe headache she will take Percocet. She states that this does not resolve her headache but it does take the edge off. In the past she has tried Imitrex and Maxalt without any benefit. She returns today for an evaluation.  HISTORY 01/03/15: Wanda [name] is a 44 year old female with a history of migraines. She returns today for follow-up. She is currently taking Topamax 150 mg at bedtime. She states that her headaches have improved. She has approximately 7 headaches per month. She will have nausea with her migraines and will  use Zofran. confirms photophobia Denies phonophobia. For severe migraines she will use Percocet and states that they works well. Her headaches are normally located on the left temporal region. She states that the last couple of migraines have been trigger by smells. 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   REVIEW OF SYSTEMS: Out of a complete 14 system review of symptoms, the patient complains only of the following symptoms, and all other reviewed systems are negative.  Headache, muscle cramps, Epworth sleepiness score 20 fatigue severity score 55  ALLERGIES: No Known Allergies  HOME MEDICATIONS: Outpatient Prescriptions Prior to Visit  Medication Sig Dispense Refill  . amLODipine (NORVASC) 5 MG tablet TAKE ONE TABLET BY MOUTH EVERY DAY 30 tablet 0  . ibuprofen (ADVIL,MOTRIN) 800 MG tablet Take 1 tablet (800 mg total) by mouth every 8 (eight) hours as needed for mild pain or moderate pain. 15 tablet 0  . predniSONE (DELTASONE) 5 MG tablet Begin taking 6 tablets daily, taper by one tablet daily until off the medication. 21 tablet 0  . topiramate (TOPAMAX) 100 MG tablet Take 2 tablets (200 mg total) by mouth at bedtime. 60 tablet 5  . methocarbamol (ROBAXIN) 500 MG tablet Take 1 tablet (500 mg total) by mouth every 6 (six) hours as needed for muscle spasms (or pain). (Patient not taking: Reported on 10/04/2015) 15 tablet 0  . oxyCODONE-acetaminophen (PERCOCET/ROXICET) 5-325 MG per tablet Take 1 tablet by mouth every 6 (six) hours as needed. Must  last 28 days. (Patient not taking: Reported on 10/04/2015) 40 tablet 0   No facility-administered medications prior to visit.    PAST MEDICAL HISTORY: Past Medical History  Diagnosis Date  . Migraine   . Hypertension   . Obesity     PAST SURGICAL HISTORY: Past Surgical History  Procedure Laterality Date  . Cesarean section    . Cholecystectomy    . Endometrial ablation       FAMILY HISTORY: Family History  Problem Relation Age of Onset  . Breast cancer      1st degree relative< 50  . Hypertension    . Kidney disease Brother     dialysis passed away 74 yrs  . Breast cancer Mother   . Cancer Maternal Uncle     Breast cancer  . Cancer Maternal Grandmother     Breast cancer    SOCIAL HISTORY: Social History   Social History  . Marital Status: Married    Spouse Name: N/A  . Number of Children: 3  . Years of Education: BA   Occupational History  . [employer]   . ACCOUNTANT   .  [employer]   Social History Main Topics  . Smoking status: Never Smoker   . Smokeless tobacco: Never Used  . Alcohol Use: Yes     Comment: wine occassionally  . Drug Use: No  . Sexual Activity: Not on file   Other Topics Concern  . Not on file   Social History Narrative      PHYSICAL EXAM  Filed Vitals:   10/04/15 1422  BP: 106/78  Pulse: 80  Height: 5' 3"  (1.6 m)  Weight: 251 lb 8 oz (114.08 kg)   Body mass index is 44.56 kg/(m^2).  Generalized: Well developed, in no acute distress  Neck: Circumference 16 inches, Mallampati 4+  Neurological examination  Mentation: Alert oriented to time, place, history taking. Follows all commands speech and language fluent Cranial nerve II-XII: Pupils were equal round reactive to light. Extraocular movements were full, visual field were full on confrontational test. Facial sensation and strength were normal. Uvula tongue midline. Head turning and shoulder shrug  were normal and symmetric. Motor: The motor testing reveals 5 over 5 strength of all 4 extremities. Good symmetric motor tone is noted throughout.  Sensory: Sensory testing is intact to soft touch on all 4 extremities. No evidence of extinction is noted.  Coordination: Cerebellar testing reveals good finger-nose-finger and heel-to-shin bilaterally.  Gait and station: Gait is normal. Tandem gait is normal. Romberg is negative. No drift is seen.    Reflexes: Deep tendon reflexes are symmetric and normal bilaterally.   DIAGNOSTIC DATA (LABS, IMAGING, TESTING) - I reviewed patient records, labs, notes, testing and imaging myself where available.   ASSESSMENT AND PLAN 44 y.o. year old female  has a past medical history of Migraine; Hypertension; and Obesity. here with:  1. Migraine headache 2. Witnessed apneic events 3. Snoring 4. Excessive daytime sleepiness  The patient continues to have migraine headaches. She would like to try a different medication. I will start the patient on nortriptyline 10 mg at bedtime. She will continue taking Topamax however she will split her dose and take 1 tablet in the morning and 1 tablet in the evening. If she receives good benefit with the nortriptyline after 2 or 3 weeks we will begin weaning the patient off the Topamax. I have explained to the patient that taking nortriptyline in combination  with  Topamax can cause central nervous system depression. She verbalized understanding. The patient also has  several risk factors for obstructive sleep apnea. For this reason I will refer the patient for a sleep study. She will return in 3-4 weeks or sooner if needed.   Ward Givens, MSN, NP-C 10/04/2015, 2:39 PM [facility] [address] [place], [place] [phone]

## 2015-10-04 NOTE — Progress Notes (Signed)
I have read the note, and I agree with the clinical assessment and plan.  Wanda [name],Wanda [name]   

## 2015-10-10 ENCOUNTER — Telehealth: Payer: Self-pay | Admitting: Adult Health

## 2015-10-10 MED ORDER — GABAPENTIN 100 MG PO CAPS: 100.0000 mg | ORAL_CAPSULE | Freq: Every day | ORAL | Status: DC

## 2015-10-10 NOTE — Telephone Encounter (Signed)
I spoke to pt and she took only one capsule (74m nortriptyline) and was dizzy, light headed, drowsy when awoken the next day.  Please advise.

## 2015-10-10 NOTE — Telephone Encounter (Signed)
I called the patient. She could not tolerate the nortriptyline. She will discontinue this medication. She will try gabapentin 100 mg at bedtime. She will continue with Topamax for now. I explained that taking Topamax and gabapentin can cause central nervous system depression. Patient verbalized understanding. She will let me know if gabapentin is effective for her headaches.

## 2015-10-10 NOTE — Telephone Encounter (Signed)
Pt called and states that she does not want to take nortriptyline (PAMELOR) 10 MG capsule. States that when she woke up the next day she was very dizzy and a day later still feels dizzy. She would like to try something different. Please call and advise [phone]

## 2015-10-30 ENCOUNTER — Institutional Professional Consult (permissible substitution): Payer: BC Managed Care – PPO | Admitting: Neurology

## 2015-11-06 ENCOUNTER — Ambulatory Visit (INDEPENDENT_AMBULATORY_CARE_PROVIDER_SITE_OTHER): Payer: BC Managed Care – PPO | Admitting: Neurology

## 2015-11-06 ENCOUNTER — Encounter: Payer: Self-pay | Admitting: Neurology

## 2015-11-06 VITALS — BP 114/82 | HR 88 | Resp 16 | Ht 65.0 in | Wt 248.0 lb

## 2015-11-06 DIAGNOSIS — R351 Nocturia: Secondary | ICD-10-CM | POA: Diagnosis not present

## 2015-11-06 DIAGNOSIS — G2581 Restless legs syndrome: Secondary | ICD-10-CM

## 2015-11-06 DIAGNOSIS — R0681 Apnea, not elsewhere classified: Secondary | ICD-10-CM

## 2015-11-06 DIAGNOSIS — R0683 Snoring: Secondary | ICD-10-CM | POA: Diagnosis not present

## 2015-11-06 DIAGNOSIS — R51 Headache: Secondary | ICD-10-CM

## 2015-11-06 DIAGNOSIS — G4719 Other hypersomnia: Secondary | ICD-10-CM

## 2015-11-06 DIAGNOSIS — R519 Headache, unspecified: Secondary | ICD-10-CM

## 2015-11-06 NOTE — Patient Instructions (Signed)

## 2015-11-06 NOTE — Progress Notes (Signed)
Subjective:    Patient ID: ORTHA METTS is a 44 y.o. female.  HPI     Star Age, MD, PhD [facility] [address] [place], [place] [address]  Dear Jinny Blossom and Lanny Hurst,   I saw your patient, Lisaann Atha, upon your kind request in my clinic today for initial consultation of her sleep disorder, in particular, concern for underlying obstructive sleep apnea. The patient is unaccompanied today. As you know, Ms. Elsberry is a 44 year old right-handed woman with an underlying medical history of hypertension, migraine headaches and severe obesity, who reports snoring, excessive daytime somnolence and witnessed breathing pauses while asleep per daughter and husband. She reports frequent morning headaches and nocturia usually once or twice per night. She has occasional restless leg symptoms and tends to move her legs at night. She often sleeps alone as her husband travels quite a bit. She lives with her husband and her 60 year old daughter. In addition, she has a 81 year old son and a 48 year old daughter. 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 She is a never smoker. She very occasionally drinks alcohol, she drinks caffeine in the form of coffee or sodas 3-4 per day. She is not aware of any family history of OSA. Her bedtime is around 9:30 or 945 and she watches TV while in bed. She may not fall asleep until after 11 typically. Her rise time is 7 AM. She does not wake up rested. She has had problems trying to lose weight. She feels like she has tried everything. She still has some migraines. I reviewed your office note from 10/04/2015. Her Epworth sleepiness score is 22 out of 24 today, her fatigue score is 55 out of 63.  Her Past Medical History Is Significant For: Past Medical History  Diagnosis Date  . Migraine   . Hypertension   . Obesity     Her Past Surgical History Is Significant For: Past Surgical  History  Procedure Laterality Date  . Cesarean section    . Cholecystectomy    . Endometrial ablation      Her Family History Is Significant For: Family History  Problem Relation Age of Onset  . Breast cancer      1st degree relative< 50  . Hypertension    . Kidney disease Brother     dialysis passed away 41 yrs  . Breast cancer Mother   . Cancer Maternal Uncle     Breast cancer  . Cancer Maternal Grandmother     Breast cancer    Her Social History Is Significant For: Social History   Social History  . Marital Status: Married    Spouse Name: N/A  . Number of Children: 3  . Years of Education: BA   Occupational History  . [employer]   . ACCOUNTANT   .  [employer]   Social History Main Topics  . Smoking status: Never Smoker   . Smokeless tobacco: Never Used  . Alcohol Use: Yes     Comment: wine occassionally  . Drug Use: No  . Sexual Activity: Not Asked   Other Topics Concern  . None   Social History Narrative    Her Allergies Are:  No Known Allergies:   Her Current Medications Are:  Outpatient Encounter Prescriptions as of 11/06/2015  Medication Sig  . amLODipine (NORVASC) 5 MG tablet TAKE ONE TABLET BY MOUTH EVERY DAY  . gabapentin (NEURONTIN) 100  MG capsule Take 1 capsule (100 mg total) by mouth at bedtime.  . topiramate (TOPAMAX) 100 MG tablet Take 2 tablets (200 mg total) by mouth at bedtime.  Marland Kitchen ibuprofen (ADVIL,MOTRIN) 800 MG tablet Take 1 tablet (800 mg total) by mouth every 8 (eight) hours as needed for mild pain or moderate pain. (Patient not taking: Reported on 11/06/2015)  . methocarbamol (ROBAXIN) 500 MG tablet Take 1 tablet (500 mg total) by mouth every 6 (six) hours as needed for muscle spasms (or pain). (Patient not taking: Reported on 10/04/2015)  . oxyCODONE-acetaminophen (PERCOCET/ROXICET) 5-325 MG per tablet Take 1 tablet by mouth every 6 (six) hours as needed. Must last 28 days. (Patient not taking: Reported on 10/04/2015)  .  [DISCONTINUED] predniSONE (DELTASONE) 5 MG tablet Begin taking 6 tablets daily, taper by one tablet daily until off the medication.   No facility-administered encounter medications on file as of 11/06/2015.  :  Review of Systems:  Out of a complete 14 point review of systems, all are reviewed and negative with the exception of these symptoms as listed below:   Review of Systems  Neurological:       Has trouble staying asleep, snoring, witnessed apnea, wakes up feeling tired, morning headaches, daytime tiredness, takes naps during the day.   Epworth Sleepiness Scale 0= would never doze 1= slight chance of dozing 2= moderate chance of dozing 3= high chance of dozing  Sitting and reading:3 Watching TV:3 Sitting inactive in a public place (ex. Theater or meeting):3 As a passenger in a car for an hour without a break:3 Lying down to rest in the afternoon:3 Sitting and talking to someone:2 Sitting quietly after lunch (no alcohol):3 In a car, while stopped in traffic:2 Total:22  Objective:  Neurologic Exam  Physical Exam Physical Examination:   Filed Vitals:   11/06/15 1441  BP: 114/82  Pulse: 88  Resp: 16    General Examination: The patient is a very pleasant 44 y.o. female in no acute distress. She appears well-developed and well-nourished and well groomed.   HEENT: Normocephalic, atraumatic, pupils are equal, round and reactive to light and accommodation. Funduscopic exam is normal with sharp disc margins noted. Extraocular tracking is good without limitation to gaze excursion or nystagmus noted. Normal smooth pursuit is noted. Hearing is grossly intact. Tympanic membranes are clear bilaterally. Face is symmetric with normal facial animation and normal facial sensation. Speech is clear with no dysarthria noted. There is no hypophonia. There is no lip, neck/head, jaw or voice tremor. Neck is supple with full range of passive and active motion. There are no carotid bruits on  auscultation. Oropharynx exam reveals: mild mouth dryness, good dental hygiene and moderate airway crowding, due to narrow airway entry, thicker and redundant soft palate and larger uvula as well as slightly elongated tongue. Mallampati is class II. Tongue protrudes centrally and palate elevates symmetrically. Tonsils are 1+ in size. Neck size is 15.5 inches. He has a Mild overbite. Nasal inspection reveals no significant nasal mucosal bogginess or redness and no septal deviation.   Chest: Clear to auscultation without wheezing, rhonchi or crackles noted.  Heart: S1+S2+0, regular and normal without murmurs, rubs or gallops noted.   Abdomen: Soft, non-tender and non-distended with normal bowel sounds appreciated on auscultation.  Extremities: There is no pitting edema in the distal lower extremities bilaterally. Pedal pulses are intact.  Skin: Warm and dry without trophic changes noted. There are no varicose veins.  Musculoskeletal: exam reveals no obvious joint deformities,  tenderness or joint swelling or erythema.   Neurologically:  Mental status: The patient is awake, alert and oriented in all 4 spheres. Her immediate and remote memory, attention, language skills and fund of knowledge are appropriate. There is no evidence of aphasia, agnosia, apraxia or anomia. Speech is clear with normal prosody and enunciation. Thought process is linear. Mood is normal and affect is normal.  Cranial nerves II - XII are as described above under HEENT exam. In addition: shoulder shrug is normal with equal shoulder height noted. Motor exam: Normal bulk, strength and tone is noted. There is no drift, tremor or rebound. Romberg is negative. Reflexes are 2+ throughout. Babinski: Toes are flexor bilaterally. Fine motor skills and coordination: intact with normal finger taps, normal hand movements, normal rapid alternating patting, normal foot taps and normal foot agility.  Cerebellar testing: No dysmetria or intention  tremor on finger to nose testing. Heel to shin is unremarkable bilaterally. There is no truncal or gait ataxia.  Sensory exam: intact to light touch, pinprick, vibration, temperature sense in the upper and lower extremities.  Gait, station and balance: She stands easily. No veering to one side is noted. No leaning to one side is noted. Posture is age-appropriate and stance is narrow based. Gait shows normal stride length and normal pace. No problems turning are noted. She turns en bloc. Tandem walk is unremarkable.    Assessment and Plan:   In summary, ARTHUR AYDELOTTE is a very pleasant 44 y.o.-year old female with an underlying medical history of hypertension, migraine headaches and severe obesity, whose history and physical exam are in keeping with obstructive sleep apnea (OSA). I had a long chat with the patient about my findings and the diagnosis of OSA, its prognosis and treatment options. We talked about medical treatments, surgical interventions and non-pharmacological approaches. I explained in particular the risks and ramifications of untreated moderate to severe OSA, especially with respect to developing cardiovascular disease down the Road, including congestive heart failure, difficult to treat hypertension, cardiac arrhythmias, or stroke. Even type 2 diabetes has, in part, been linked to untreated OSA. Symptoms of untreated OSA include daytime sleepiness, memory problems, mood irritability and mood disorder such as depression and anxiety, lack of energy, as well as recurrent headaches, especially morning headaches. We talked about trying to maintain a healthy lifestyle in general, as well as the importance of weight control. I encouraged the patient to eat healthy, exercise daily and keep well hydrated, to keep a scheduled bedtime and wake time routine, to not skip any meals and eat healthy snacks in between meals. I advised the patient not to drive when feeling sleepy. I recommended the  following at this time: sleep study with potential positive airway pressure titration. (We will score hypopneas at 3% and split the sleep study into diagnostic and treatment portion, if the estimated. 2 hour AHI is >15/h).   I explained the sleep test procedure to the patient and also outlined possible surgical and non-surgical treatment options of OSA, including the use of a custom-made dental device (which would require a referral to a specialist dentist or oral surgeon), upper airway surgical options, such as pillar implants, radiofrequency surgery, tongue base surgery, and UPPP (which would involve a referral to an ENT surgeon). Rarely, jaw surgery such as mandibular advancement may be considered.  I also explained the CPAP treatment option to the patient, who indicated that she would be willing to try CPAP if the need arises. I explained the importance  of being compliant with PAP treatment, not only for insurance purposes but primarily to improve Her symptoms, and for the patient's long term health benefit, including to reduce Her cardiovascular risks. I answered all her questions today and the patient was in agreement. I would like to see her back after the sleep study is completed and encouraged her to call with any interim questions, concerns, problems or updates.   Thank you very much for allowing me to participate in the care of this nice patient. If I can be of any further assistance to you please do not hesitate to talk to me.   Sincerely,   Star Age, MD, PhD

## 2015-11-29 ENCOUNTER — Ambulatory Visit (INDEPENDENT_AMBULATORY_CARE_PROVIDER_SITE_OTHER): Payer: BC Managed Care – PPO | Admitting: Neurology

## 2015-11-29 DIAGNOSIS — G4733 Obstructive sleep apnea (adult) (pediatric): Secondary | ICD-10-CM | POA: Diagnosis not present

## 2015-11-30 NOTE — Sleep Study (Signed)
Please see the scanned sleep study interpretation located in the procedure tab within the chart review section.   

## 2015-12-04 ENCOUNTER — Telehealth: Payer: Self-pay | Admitting: Neurology

## 2015-12-04 DIAGNOSIS — G4733 Obstructive sleep apnea (adult) (pediatric): Secondary | ICD-10-CM

## 2015-12-04 NOTE — Telephone Encounter (Signed)
Patient referred by Dr. Jannifer Franklin and Jinny Blossom, seen by me on 11/06/15, diagnostic PSG on 11/29/15, ins: BCBS.   Please call and notify the patient that the recent sleep study did confirm the diagnosis of obstructive sleep apnea and that I recommend treatment for this in the form of CPAP. This will require a repeat sleep study for proper titration and mask fitting. Please explain to patient and arrange for a CPAP titration study. I have placed an order in the chart. Thanks, and please route to [facility] for scheduling next sleep study.  Star Age, MD, PhD [facility])

## 2015-12-05 NOTE — Telephone Encounter (Signed)
I spoke to patient and she is aware of results and recommendations. She would like to proceed with titration study but requests a warmer room for next study.

## 2015-12-19 ENCOUNTER — Ambulatory Visit (INDEPENDENT_AMBULATORY_CARE_PROVIDER_SITE_OTHER): Payer: BC Managed Care – PPO | Admitting: Neurology

## 2015-12-19 DIAGNOSIS — G4733 Obstructive sleep apnea (adult) (pediatric): Secondary | ICD-10-CM

## 2015-12-20 ENCOUNTER — Telehealth: Payer: Self-pay | Admitting: Neurology

## 2015-12-20 DIAGNOSIS — G4733 Obstructive sleep apnea (adult) (pediatric): Secondary | ICD-10-CM

## 2015-12-20 NOTE — Sleep Study (Signed)
Please see the scanned sleep study interpretation located in the procedure tab within the chart review section.   

## 2015-12-20 NOTE — Telephone Encounter (Signed)
Patient referred by Dr. Jannifer Franklin and Jinny Blossom, seen by me on 11/06/15, diagnostic PSG on 11/29/15, CPAP study on 12/19/15, ins: BCBS.  Please call and inform patient that I have entered an order for treatment with positive airway pressure (PAP) treatment of obstructive sleep apnea (OSA). She did well during the latest sleep study with CPAP. We will, therefore, arrange for a machine for home use through a DME (durable medical equipment) company of Her choice; and I will see the patient back in follow-up in about 8-10 weeks. Please also explain to the patient that I will be looking out for compliance data, which can be downloaded from the machine (stored on an SD card, that is inserted in the machine) or via remote access through a modem, that is built into the machine. At the time of the followup appointment we will discuss sleep study results and how it is going with PAP treatment at home. Please advise patient to bring Her machine at the time of the first FU visit, even though this is cumbersome. Bringing the machine for every visit after that will likely not be needed, but often helps for the first visit to troubleshoot if needed. Please re-enforce the importance of compliance with treatment and the need for [place] to monitor compliance data - often an insurance requirement and actually good feedback for the patient as far as how they are doing.  Also remind patient, that any interim PAP machine or mask issues should be first addressed with the DME company, as they can often help better with technical and mask fit issues. Please ask if patient has a preference regarding DME company.  Please also make sure, the patient has a follow-up appointment with me in about 8-10 weeks from the setup date, thanks.  Once you have spoken to the patient - and faxed/routed report to PCP and referring MD (if other than PCP), you can close this encounter, thanks,   Star Age, MD, PhD [facility] ([place])

## 2015-12-20 NOTE — Telephone Encounter (Signed)
I spoke to patient and she is aware of results and recommendations. She is willing to proceed with treatment. She asked for orders to be sent to [facility]. I have sent referral there. I will send the patient a letter to remind her to make f/u appt and stress the importance of compliance. I will also send report to PCP.

## 2015-12-31 ENCOUNTER — Telehealth: Payer: Self-pay

## 2015-12-31 NOTE — Telephone Encounter (Signed)
Hi Wanda [name].  This patient has declined CPAP set up at this time due to finances.  Thanks  [employer]

## 2016-01-04 ENCOUNTER — Telehealth: Payer: Self-pay

## 2016-01-04 NOTE — Telephone Encounter (Signed)
Wanda [name], this patient is requesting a new order for CPAP.  She said that [facility] treated her very rudely and she will not do business with them.  She would also like a call and can be reached at [phone]

## 2016-01-07 ENCOUNTER — Telehealth: Payer: Self-pay | Admitting: Neurology

## 2016-01-07 NOTE — Telephone Encounter (Signed)
I spoke to patient and she is aware that we will send order to [facility]. Order has been faxed.

## 2016-01-07 NOTE — Telephone Encounter (Signed)
Patient is again calling about the new order for her CPAP supplies and states she is waiting for a call back.  Thanks!

## 2016-01-10 ENCOUNTER — Encounter: Payer: Self-pay | Admitting: Adult Health

## 2016-01-10 ENCOUNTER — Ambulatory Visit (INDEPENDENT_AMBULATORY_CARE_PROVIDER_SITE_OTHER): Payer: BC Managed Care – PPO | Admitting: Adult Health

## 2016-01-10 VITALS — BP 109/78 | HR 98 | Ht 63.0 in | Wt 245.0 lb

## 2016-01-10 DIAGNOSIS — G43019 Migraine without aura, intractable, without status migrainosus: Secondary | ICD-10-CM | POA: Diagnosis not present

## 2016-01-10 DIAGNOSIS — G4733 Obstructive sleep apnea (adult) (pediatric): Secondary | ICD-10-CM | POA: Diagnosis not present

## 2016-01-10 MED ORDER — GABAPENTIN 100 MG PO CAPS: 200.0000 mg | ORAL_CAPSULE | Freq: Every day | ORAL | Status: DC

## 2016-01-10 MED ORDER — TOPIRAMATE 100 MG PO TABS: 100.0000 mg | ORAL_TABLET | Freq: Two times a day (BID) | ORAL | Status: DC

## 2016-01-10 NOTE — Progress Notes (Signed)
I have read the note, and I agree with the clinical assessment and plan.  WILLIS,CHARLES KEITH   

## 2016-01-10 NOTE — Progress Notes (Signed)
PATIENT: Wanda [name] DOB: [birth year]  REASON FOR VISIT: follow up HISTORY FROM: patient  HISTORY OF PRESENT ILLNESS:  Wanda [name]  Is a 45 year old female with a history of migraine headaches. She returns today for follow-up. She is currently taking Topamax and gabapentin. She states initially the gabapentin was beneficial. Her headache frequency decreased from daily to 5-6 headaches a month. She states that unfortunately in the last week and a half she's developed daily headaches again. Her headaches are located either in the right or left temporal. She does have photophobia and phonophobia and she states for the first time she had a vomiting episode with her headache last week. The patient did have a sleep study and was diagnosed with sleep apnea. She is currently working with a [facility] on getting a CPAP machine. She denies any new neurological symptoms. She returns today for an evaluation.  HISTORY 10/04/15: Wanda [name] is a 45 year old female with a history of migraine headaches. She returns today for follow-up. She continues to take Topamax 200 mg at bedtime. She states that in the last 2 months her headache frequency has not gotten any better. She states that she has approximately 7-8 migraines each month. Her migraines are normally located in the left temporal region. She confirms photophobia and phonophobia but denies nausea and vomiting. At this time the patient would like to try another medication to help with her headaches. She also confirms that she snores and her daughter has witnessed apnea events. She also states that she is sleepy and fatigued during the day and most often will wake up with a headache. She denies ever having a sleep study. She returns today for an evaluation.  REVIEW OF SYSTEMS: Out of a complete 14 system review of symptoms, the patient complains only of the following symptoms, and all other reviewed systems are negative.   see history of present  illness  ALLERGIES: No Known Allergies  HOME MEDICATIONS: Outpatient Prescriptions Prior to Visit  Medication Sig Dispense Refill  . amLODipine (NORVASC) 5 MG tablet TAKE ONE TABLET BY MOUTH EVERY DAY 30 tablet 0  . gabapentin (NEURONTIN) 100 MG capsule Take 1 capsule (100 mg total) by mouth at bedtime. 30 capsule 3  . ibuprofen (ADVIL,MOTRIN) 800 MG tablet Take 1 tablet (800 mg total) by mouth every 8 (eight) hours as needed for mild pain or moderate pain. (Patient not taking: Reported on 11/06/2015) 15 tablet 0  . methocarbamol (ROBAXIN) 500 MG tablet Take 1 tablet (500 mg total) by mouth every 6 (six) hours as needed for muscle spasms (or pain). (Patient not taking: Reported on 10/04/2015) 15 tablet 0  . oxyCODONE-acetaminophen (PERCOCET/ROXICET) 5-325 MG per tablet Take 1 tablet by mouth every 6 (six) hours as needed. Must last 28 days. (Patient not taking: Reported on 10/04/2015) 40 tablet 0  . topiramate (TOPAMAX) 100 MG tablet Take 2 tablets (200 mg total) by mouth at bedtime. 60 tablet 5   No facility-administered medications prior to visit.    PAST MEDICAL HISTORY: Past Medical History  Diagnosis Date  . Migraine   . Hypertension   . Obesity     PAST SURGICAL HISTORY: Past Surgical History  Procedure Laterality Date  . Cesarean section    . Cholecystectomy    . Endometrial ablation      FAMILY HISTORY: Family History  Problem Relation Age of Onset  . Breast cancer      1st degree relative< 50  . Hypertension    .  Kidney disease Brother     dialysis passed away 25 yrs  . Breast cancer Mother   . Cancer Maternal Uncle     Breast cancer  . Cancer Maternal Grandmother     Breast cancer    SOCIAL HISTORY: Social History   Social History  . Marital Status: Married    Spouse Name: N/A  . Number of Children: 3  . Years of Education: BA   Occupational History  . [employer]   . ACCOUNTANT   .  [employer]   Social History Main Topics  . Smoking status:  Never Smoker   . Smokeless tobacco: Never Used  . Alcohol Use: Yes     Comment: wine occassionally  . Drug Use: No  . Sexual Activity: Not on file   Other Topics Concern  . Not on file   Social History Narrative      PHYSICAL EXAM  Filed Vitals:   01/10/16 1540  BP: 109/78  Pulse: 98  Height: 5' 3"  (1.6 m)  Weight: 245 lb (111.131 kg)   Body mass index is 43.41 kg/(m^2).  Generalized: Well developed, in no acute distress   Neurological examination  Mentation: Alert oriented to time, place, history taking. Follows all commands speech and language fluent Cranial nerve II-XII: Pupils were equal round reactive to light. Extraocular movements were full, visual field were full on confrontational test. Facial sensation and strength were normal. Uvula tongue midline. Head turning and shoulder shrug  were normal and symmetric. Motor: The motor testing reveals 5 over 5 strength of all 4 extremities. Good symmetric motor tone is noted throughout.  Sensory: Sensory testing is intact to soft touch on all 4 extremities. No evidence of extinction is noted.  Coordination: Cerebellar testing reveals good finger-nose-finger and heel-to-shin bilaterally.  Gait and station: Gait is normal. Tandem gait is normal. Romberg is negative. No drift is seen.  Reflexes: Deep tendon reflexes are symmetric and normal bilaterally.   DIAGNOSTIC DATA (LABS, IMAGING, TESTING) - I reviewed patient records, labs, notes, testing and imaging myself where available.  ASSESSMENT AND PLAN 45 y.o. year old female  has a past medical history of Migraine; Hypertension; and Obesity. here with:   1. Migraine headaches   The patient's headaches have increased. We will increase gabapentin to 200 mg at bedtime. She will continue taking Topamax 100 mg twice a day. Patient advised that if she does not hear from the  DME company by next week she should call and let us know. If her headaches do not improve she should let us  know. She will follow-up in 4 months or sooner if needed.     Ward Givens, MSN, NP-C 01/10/2016, 3:46 PM [facility] [address] [place], [place] [address] [phone]

## 2016-01-10 NOTE — Patient Instructions (Signed)
Continue topamax Increase gabapentin 200 mg (2 tablets) at bedtime If your symptoms worsen or you develop new symptoms please let us know.  If the new DME does not contact you by the middle of next week call me.

## 2016-02-28 ENCOUNTER — Telehealth: Payer: Self-pay

## 2016-02-28 ENCOUNTER — Ambulatory Visit: Payer: BC Managed Care – PPO | Admitting: Neurology

## 2016-02-28 NOTE — Telephone Encounter (Signed)
Patient mixed up appt times and rescheduled for next week.

## 2016-03-04 ENCOUNTER — Telehealth: Payer: Self-pay

## 2016-03-04 NOTE — Telephone Encounter (Signed)
I spoke to patient and rescheduled appt to Thursday due to provider scheduling conflict. She was able to take appt.

## 2016-03-05 ENCOUNTER — Encounter: Payer: Self-pay | Admitting: Neurology

## 2016-03-05 ENCOUNTER — Ambulatory Visit: Payer: BC Managed Care – PPO | Admitting: Neurology

## 2016-03-06 ENCOUNTER — Ambulatory Visit (INDEPENDENT_AMBULATORY_CARE_PROVIDER_SITE_OTHER): Payer: BC Managed Care – PPO | Admitting: Neurology

## 2016-03-06 ENCOUNTER — Encounter: Payer: Self-pay | Admitting: Neurology

## 2016-03-06 VITALS — BP 104/72 | HR 88 | Resp 18 | Ht 63.0 in | Wt 244.0 lb

## 2016-03-06 DIAGNOSIS — G4733 Obstructive sleep apnea (adult) (pediatric): Secondary | ICD-10-CM

## 2016-03-06 DIAGNOSIS — R51 Headache: Secondary | ICD-10-CM

## 2016-03-06 DIAGNOSIS — Z9989 Dependence on other enabling machines and devices: Principal | ICD-10-CM

## 2016-03-06 DIAGNOSIS — R519 Headache, unspecified: Secondary | ICD-10-CM

## 2016-03-06 NOTE — Patient Instructions (Signed)
Please continue using your CPAP regularly. While your insurance requires that you use CPAP at least 4 hours each night on 70% of the nights, I recommend, that you not skip any nights and use it throughout the night if you can. Getting used to CPAP and staying with the treatment long term does take time and patience and discipline. Untreated obstructive sleep apnea when it is moderate to severe can have an adverse impact on cardiovascular health and raise her risk for heart disease, arrhythmias, hypertension, congestive heart failure, stroke and diabetes. Untreated obstructive sleep apnea causes sleep disruption, nonrestorative sleep, and sleep deprivation. This can have an impact on your day to day functioning and cause daytime sleepiness and impairment of cognitive function, memory loss, mood disturbance, and problems focussing. Using CPAP regularly can improve these symptoms.  Keep up the good work! I will see you back in 6 months for sleep apnea check up, and if you continue to do well on CPAP I will see you once a year thereafter.

## 2016-03-06 NOTE — Progress Notes (Signed)
Subjective:    Patient ID: Wanda [name] is a 45 y.o. female.  HPI     Interim history:   Wanda [name] is a 45 year old right-handed woman with an underlying medical history of hypertension, migraine headaches and severe obesity, who presents for follow-up consultation of her obstructive sleep apnea, after her recent sleep studies. The patient is unaccompanied today. Of note, she no showed for an appointment on 02/28/2016. I first met her on 11/06/2015 at the request of Dr. Jannifer [name] and Ward Givens, at which time the patient reported snoring, witnessed breathing pauses while asleep and excessive daytime somnolence as well as morning headaches and nocturia. I invited her back for sleep study. She had a baseline sleep study, followed by a CPAP titration study. I went over her test results with her in detail today. She had a baseline sleep study on 11/29/2015. Sleep efficiency was 66.7% with a latency to sleep of 138 minutes and wake after sleep onset of 15 minutes. She had an increased percentage of slow-wave sleep at 22% and REM sleep at 27.8%. She had no significant PLMS. She had no significant EEG or EKG changes. Mild to moderate snoring was noted. Supine sleep was not achieved. Total AHI was 7.3 per hour, rising to 22.6 per hour during REM sleep. Average oxygen saturation was 94%, nadir was 86%. Based on her sleep-related complaints I invited her back for a CPAP titration study. She had this on 12/19/2015. Sleep efficiency was 93.7%, latency to sleep was 16 minutes, wake after sleep onset was 9 minutes. She had a normal arousal index. She had a mildly increased percentage of stage II sleep, a mildly decreased percentage of slow-wave sleep, and a mildly increased percentage of REM sleep at 27.8 with a mildly reduced REM latency of 51 minutes. She had no significant PLMS, EKG or EEG changes. CPAP was titrated from 5 cm to 7 cm. Average oxygen saturation was 98%, nadir was 92%. AHI was 0 per hour at a  pressure of 6 cm with supine REM sleep achieved. Based on her test results I prescribed CPAP therapy for home use.  Today, 03/05/2016: I reviewed her CPAP compliance data from 01/28/2016 through 02/26/2016 which is a total of 30 days during which time she used her machine 30 days with percent used days greater than 4 hours at 100%, indicating superb compliance with an average usage of 8 hours and 2 minutes, residual AHI at 1 per hour, leak low with the 95th percentile at 4.2 L/m on a pressure of 6 cm with EPR of 3.  Today, 03/05/2016: She reports that she had issues with nose dryness and occasional nosebleeds. Otherwise, she is compliant with treatment, she indicates in fact improvement in her sleep quality and her morning headaches, she feels that her migraines overall are little bit better. Daytime energy is a little bit better as well. She is trying to lose weight and has in fact lost a few pounds. She has noticed some soreness in her right upper arm, in the biceps area.  Previously:  11/06/2015: She reports snoring, excessive daytime somnolence and witnessed breathing pauses while asleep per daughter and husband. She reports frequent morning headaches and nocturia usually once or twice per night. She has occasional restless leg symptoms and tends to move her legs at night. She often sleeps alone as her husband travels quite a bit. She lives with her husband and her 40 year old daughter. In addition, she has a 12 year old son and a 56 year old daughter. 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 She is a never smoker. She very occasionally drinks alcohol, she drinks caffeine in the form of coffee or sodas 3-4 per day. She is not aware of any family history of OSA. Her bedtime is around 9:30 or 945 and she watches TV while in bed. She may not fall asleep until after 11 typically. Her rise time is 7 AM. She does not wake up rested. She has had problems trying to  lose weight. She feels like she has tried everything. She still has some migraines. I reviewed your office note from 10/04/2015. Her Epworth sleepiness score is 22 out of 24 today, her fatigue score is 55 out of 63.  Her Past Medical History Is Significant For: Past Medical History  Diagnosis Date  . Migraine   . Hypertension   . Obesity     Her Past Surgical History Is Significant For: Past Surgical History  Procedure Laterality Date  . Cesarean section    . Cholecystectomy    . Endometrial ablation      Her Family History Is Significant For: Family History  Problem Relation Age of Onset  . Breast cancer      1st degree relative< 50  . Hypertension    . Kidney disease Brother     dialysis passed away 25 yrs  . Breast cancer Mother   . Cancer Maternal Uncle     Breast cancer  . Cancer Maternal Grandmother     Breast cancer    Her Social History Is Significant For: Social History   Social History  . Marital Status: Married    Spouse Name: N/A  . Number of Children: 3  . Years of Education: BA   Occupational History  . [employer]   . ACCOUNTANT   .  [employer]   Social History Main Topics  . Smoking status: Never Smoker   . Smokeless tobacco: Never Used  . Alcohol Use: Yes     Comment: wine occassionally  . Drug Use: No  . Sexual Activity: Not Asked   Other Topics Concern  . None   Social History Narrative    Her Allergies Are:  No Known Allergies:   Her Current Medications Are:  Outpatient Encounter Prescriptions as of 03/06/2016  Medication Sig  . amLODipine (NORVASC) 5 MG tablet TAKE ONE TABLET BY MOUTH EVERY DAY  . gabapentin (NEURONTIN) 100 MG capsule Take 2 capsules (200 mg total) by mouth at bedtime.  Marland Kitchen ibuprofen (ADVIL,MOTRIN) 800 MG tablet Take 1 tablet (800 mg total) by mouth every 8 (eight) hours as needed for mild pain or moderate pain.  . methocarbamol (ROBAXIN) 500 MG tablet Take 1 tablet (500 mg total) by mouth every 6 (six) hours  as needed for muscle spasms (or pain).  . topiramate (TOPAMAX) 100 MG tablet Take 1 tablet (100 mg total) by mouth 2 (two) times daily.   No facility-administered encounter medications on file as of 03/06/2016.  :  Review of Systems:  Out of a complete 14 point review of systems, all are reviewed and negative with the exception of these symptoms as listed below:    Review of Systems  Neurological:       Patient feels like she is doing ok on CPAP. Reports that she has to blow her nose every morning and sometimes her nose is dry or bleeding.     Objective:  Neurologic Exam  Physical Exam Physical Examination:   Filed Vitals:   03/06/16 1442  BP: 104/72  Pulse: 88  Resp: 18    General Examination: The patient is a very pleasant 45 y.o. female in no acute distress. She appears well-developed and well-nourished and well groomed. She is in good spirits today.  HEENT: Normocephalic, atraumatic, pupils are equal, round and reactive to light and accommodation. Extraocular tracking is good without limitation to gaze excursion or nystagmus noted. Normal smooth pursuit is noted. Hearing is grossly intact. Face is symmetric with normal facial animation and normal facial sensation. Speech is clear with no dysarthria noted. There is no hypophonia. There is no lip, neck/head, jaw or voice tremor. Neck is supple with full range of passive and active motion. There are no carotid bruits on auscultation. Oropharynx exam reveals: mild mouth dryness, good dental hygiene and moderate airway crowding, due to narrow airway entry, thicker and redundant soft palate and larger uvula as well as slightly elongated tongue. Mallampati is class II. Tongue protrudes centrally and palate elevates symmetrically. Tonsils are 1+ in size. Nasal inspection reveals no significant nasal mucosal bogginess or redness and no septal deviation. No sores around the nostrils, no dried blood, nasal mucosa slightly dry  appearing.  Chest: Clear to auscultation without wheezing, rhonchi or crackles noted.  Heart: S1+S2+0, regular and normal without murmurs, rubs or gallops noted.   Abdomen: Soft, non-tender and non-distended with normal bowel sounds appreciated on auscultation.  Extremities: There is no pitting edema in the distal lower extremities bilaterally. Pedal pulses are intact.  Skin: Warm and dry without trophic changes noted. There are no varicose veins.  Musculoskeletal: exam reveals no obvious joint deformities, tenderness or joint swelling or erythema. She has no tenderness in the musclesof her right upper arm, no obvious swelling in the biceps area.   Neurologically:  Mental status: The patient is awake, alert and oriented in all 4 spheres. Her immediate and remote memory, attention, language skills and fund of knowledge are appropriate. There is no evidence of aphasia, agnosia, apraxia or anomia. Speech is clear with normal prosody and enunciation. Thought process is linear. Mood is normal and affect is normal.  Cranial nerves II - XII are as described above under HEENT exam. In addition: shoulder shrug is normal with equal shoulder height noted. Motor exam: Normal bulk, strength and tone is noted. There is no drift, tremor or rebound. Romberg is negative. Reflexes are 2+ throughout. Fine motor skills and coordination: intact with normal finger taps, normal hand movements, normal rapid alternating patting, normal foot taps and normal foot agility.  Cerebellar testing: No dysmetria or intention tremor on finger to nose testing. Heel to shin is unremarkable bilaterally. There is no truncal or gait ataxia.  Sensory exam: intact to light touch in the upper and lower extremities.  Gait, station and balance: She stands easily. No veering to one side is noted. No leaning to one side is noted. Posture is age-appropriate and stance is narrow based. Gait shows normal stride length and normal pace. No  problems turning are noted. Tandem walk is unremarkable.    Assessment and Plan:   In summary, Wanda [name] is a very pleasant 45 year old female with an underlying medical history of hypertension, migraine headaches and severe obesity, who presents for follow-up consultation of her obstructive sleep apnea. She is now established on CPAP therapy with good compliance and good results. She is commended for her full compliance. We talked about her baseline sleep study  results from August 2016 as well as her CPAP titration results from January 2017 in detail today. We also reviewed her compliance data together in detail. Her physical exam is stable. She is endorsing improved daytime energy and headaches. She is working on weight loss. She is commended for this. From my end of things, she is doing well.  I explained in particular the risks and ramifications of untreated moderate to severe OSA, especially with respect to developing cardiovascular disease down the Road, including congestive heart failure, difficult to treat hypertension, cardiac arrhythmias, or stroke. Even type 2 diabetes has, in part, been linked to untreated OSA. Symptoms of untreated OSA include daytime sleepiness, memory problems, mood irritability and mood disorder such as depression and anxiety, lack of energy, as well as recurrent headaches, especially morning headaches. We talked about trying to maintain a healthy lifestyle in general, as well as the importance of weight control. I explained the importance of being compliant with PAP treatment, not only for insurance purposes but primarily to improve Her symptoms, and for the patient's long term health benefit, including to reduce Her cardiovascular risks.I would like to see her back in 6 months, sooner if the need arises. I answered all her questions today and she was in agreement.  I spent 25 minutes in total face-to-face time with the patient, more than 50% of which was spent in  counseling and coordination of care, reviewing test results, reviewing medication and discussing or reviewing the diagnosis of OSA, its prognosis and treatment options.

## 2016-05-12 ENCOUNTER — Encounter: Payer: Self-pay | Admitting: Adult Health

## 2016-05-12 ENCOUNTER — Ambulatory Visit (INDEPENDENT_AMBULATORY_CARE_PROVIDER_SITE_OTHER): Payer: BC Managed Care – PPO | Admitting: Adult Health

## 2016-05-12 VITALS — BP 114/82 | HR 85 | Ht 63.0 in | Wt 248.2 lb

## 2016-05-12 DIAGNOSIS — G43009 Migraine without aura, not intractable, without status migrainosus: Secondary | ICD-10-CM | POA: Diagnosis not present

## 2016-05-12 MED ORDER — GABAPENTIN 100 MG PO CAPS: 300.0000 mg | ORAL_CAPSULE | Freq: Every day | ORAL | Status: DC

## 2016-05-12 NOTE — Progress Notes (Signed)
PATIENT: Wanda [name] DOB: 1971/05/10  REASON FOR VISIT: follow up- migraine HISTORY FROM: patient  HISTORY OF PRESENT ILLNESS: Wanda [name] is a 45 year old female with a history of migraine headaches. She returns today for follow-up. She continues to take Topamax 100 mg twice a day and Gabapentin 200 mg at bedtime. She reports that initially  increasing gabapentin was beneficial for her headaches however she is now having 7 headaches a month. Her headaches are typically located in the temporal regions. She reports that it typically alternates from the right to left side. When she does have a headache on a scale 1-10 she rates her pain a 10. She denies phonophobia but does state that bright lights affect her head. She does have nausea and vomiting. In the past she has tried nortriptyline but was unable to tolerate it. She is also on CPAP and is followed by Dr.Athar with good compliance. She returns today for an evaluation.  HISTORY 01/10/16: Wanda [name] Is a 45 year old female with a history of migraine headaches. She returns today for follow-up. She is currently taking Topamax and gabapentin. She states initially the gabapentin was beneficial. Her headache frequency decreased from daily to 5-6 headaches a month. She states that unfortunately in the last week and a half she's developed daily headaches again. Her headaches are located either in the right or left temporal. She does have photophobia and phonophobia and she states for the first time she had a vomiting episode with her headache last week. The patient did have a sleep study and was diagnosed with sleep apnea. She is currently working with a [facility] on getting a CPAP machine. She denies any new neurological symptoms. She returns today for an evaluation.  HISTORY 10/04/15: Wanda [name] is a 45 year old female with a history of migraine headaches. She returns today for follow-up. She continues to take Topamax 200 mg at bedtime. She states that  in the last 2 months her headache frequency has not gotten any better. She states that she has approximately 7-8 migraines each month. Her migraines are normally located in the left temporal region. She confirms photophobia and phonophobia but denies nausea and vomiting. At this time the patient would like to try another medication to help with her headaches. She also confirms that she snores and her daughter has witnessed apnea events. She also states that she is sleepy and fatigued during the day and most often will wake up with a headache. She denies ever having a sleep study. She returns today for an evaluation.  REVIEW OF SYSTEMS: Out of a complete 14 system review of symptoms, the patient complains only of the following symptoms, and all other reviewed systems are negative.  See history of present illness  ALLERGIES: No Known Allergies  HOME MEDICATIONS: Outpatient Prescriptions Prior to Visit  Medication Sig Dispense Refill  . amLODipine (NORVASC) 5 MG tablet TAKE ONE TABLET BY MOUTH EVERY DAY 30 tablet 0  . methocarbamol (ROBAXIN) 500 MG tablet Take 1 tablet (500 mg total) by mouth every 6 (six) hours as needed for muscle spasms (or pain). 15 tablet 0  . topiramate (TOPAMAX) 100 MG tablet Take 1 tablet (100 mg total) by mouth 2 (two) times daily. 60 tablet 5  . gabapentin (NEURONTIN) 100 MG capsule Take 2 capsules (200 mg total) by mouth at bedtime. 60 capsule 5  . ibuprofen (ADVIL,MOTRIN) 800 MG tablet Take 1 tablet (800 mg total) by mouth every 8 (eight) hours as needed for mild pain  or moderate pain. 15 tablet 0   No facility-administered medications prior to visit.    PAST MEDICAL HISTORY: Past Medical History  Diagnosis Date  . Migraine   . Hypertension   . Obesity     PAST SURGICAL HISTORY: Past Surgical History  Procedure Laterality Date  . Cesarean section    . Cholecystectomy    . Endometrial ablation      FAMILY HISTORY: Family History  Problem Relation Age of  Onset  . Breast cancer      1st degree relative< 50  . Hypertension    . Kidney disease Brother     dialysis passed away 8 yrs  . Breast cancer Mother   . Cancer Maternal Uncle     Breast cancer  . Cancer Maternal Grandmother     Breast cancer    SOCIAL HISTORY: Social History   Social History  . Marital Status: Married    Spouse Name: N/A  . Number of Children: 3  . Years of Education: BA   Occupational History  . [employer]   . ACCOUNTANT   .  [place]   Social History Main Topics  . Smoking status: Never Smoker   . Smokeless tobacco: Never Used  . Alcohol Use: Yes     Comment: wine occassionally  . Drug Use: No  . Sexual Activity: Not on file   Other Topics Concern  . Not on file   Social History Narrative      PHYSICAL EXAM  Filed Vitals:   05/12/16 1450  BP: 114/82  Pulse: 85  Height: 5' 3"  (1.6 m)  Weight: 248 lb 3.2 oz (112.583 kg)   Body mass index is 43.98 kg/(m^2).  Generalized: Well developed, in no acute distress   Neurological examination  Mentation: Alert oriented to time, place, history taking. Follows all commands speech and language fluent Cranial nerve II-XII: Pupils were equal round reactive to light. Extraocular movements were full, visual field were full on confrontational test. Facial sensation and strength were normal. Uvula tongue midline. Head turning and shoulder shrug  were normal and symmetric. Motor: The motor testing reveals 5 over 5 strength of all 4 extremities. Good symmetric motor tone is noted throughout.  Sensory: Sensory testing is intact to soft touch on all 4 extremities. No evidence of extinction is noted.  Coordination: Cerebellar testing reveals good finger-nose-finger and heel-to-shin bilaterally.  Gait and station: Gait is normal. Tandem gait is normal. Romberg is negative. No drift is seen.  Reflexes: Deep tendon reflexes are symmetric and normal bilaterally.   DIAGNOSTIC DATA (LABS, IMAGING,  TESTING) - I reviewed patient records, labs, notes, testing and imaging myself where available.  ASSESSMENT AND PLAN 45 y.o. year old female  has a past medical history of Migraine; Hypertension; and Obesity. here with:  1. Migraine headache  The patient continues to have headaches. I will increase gabapentin to 300 mg at bedtime. She will continue on Topamax 200 mg daily. Patient advised that if her headache frequency or severity does not improve she should let us know. She will follow-up in 6 months with Dr. Jannifer Franklin or sooner if needed.   Ward Givens, MSN, NP-C 05/12/2016, 3:11 PM [facility] [address], [place], [place] [address] [phone]

## 2016-05-12 NOTE — Patient Instructions (Signed)
Increase gabapentin to 300 mg at bedtime Continue Topamax 200 mg daily If your symptoms worsen or you develop new symptoms please let us know.

## 2016-05-14 NOTE — Progress Notes (Signed)
I reviewed note and agree with plan.   Penni Bombard, MD 8/88/9169, 4:50 PM Certified in Neurology, Neurophysiology and Neuroimaging  [facility] [address], [place], [place] [address] [phone]

## 2016-06-25 ENCOUNTER — Telehealth: Payer: Self-pay | Admitting: Adult Health

## 2016-06-25 MED ORDER — DEXAMETHASONE 2 MG PO TABS: ORAL_TABLET | ORAL | 0 refills | Status: DC

## 2016-06-25 NOTE — Telephone Encounter (Signed)
I called patient. The patient has had a bad headache for 3 days. She has some nausea, but she does have medications for this at home. I will call in a prescription for Decadron to take over the next 3 days. She will contact our office if she is not doing well.

## 2016-06-25 NOTE — Telephone Encounter (Signed)
Patient is calling. She has had a migraine x3 days and would like a Rx called in. Please call to [facility] on [address].

## 2016-06-25 NOTE — Telephone Encounter (Addendum)
Spoke to pt.  She has had migraine for the last 3 days. She is asking for something to be called in. [facility].   Level 10 today with nausea.  Has taken advil migraine which she stated helped very slightly yesterday.  She has been taking gabapentin 359m po qhs, and topamax 2069mdaily since last seen in 05/2016. She has not had infusion before.  She has no driver.  Home # best number to call.  MM/NP out of office.   Will send to her primary MD.

## 2016-06-25 NOTE — Addendum Note (Signed)
Addended by: Margette Fast on: 06/25/2016 03:38 PM   Modules accepted: Orders

## 2016-07-08 ENCOUNTER — Encounter: Payer: Self-pay | Admitting: *Deleted

## 2016-07-08 MED ORDER — SUMATRIPTAN SUCCINATE 25 MG PO TABS: 25.0000 mg | ORAL_TABLET | ORAL | 0 refills | Status: DC | PRN

## 2016-07-08 NOTE — Telephone Encounter (Signed)
Spoke to patient again - she has noticed an increase in headaches and estimates 12-13 days in the last month with migraines.  She has tried Imitrex and Maxalt in the past.  States she tolerated both medications without side effects - they just stopped working.  She denies any cardiovascular risk.

## 2016-07-08 NOTE — Telephone Encounter (Signed)
Spoke to patient - she developed another migraine 07/07/16 that has failed to respond to OTC Excedrin migraine.  She is still taking topiramate 115m, BID and gabapentin 3026m qhs.  She took a three day course of dexamethasone two weeks ago from Dr. WiJannifer Franklin The medication helped but caused her GI side effects.

## 2016-07-08 NOTE — Telephone Encounter (Signed)
Per vo by Jinny Blossom, check with patient to see if she would like to retry sumatriptan or rizatriptan.  Patient was agreeable to sumatriptan and new rx was provided by NP.  Also, scheduled patient for a one month follow up.  She will call back with any continued concerns prior to her appointment.

## 2016-07-08 NOTE — Telephone Encounter (Signed)
Patient called regarding migraine, states migraine went away, has come back as of yesterday, would like for Megan to call something in for her. Declined appointment August 10th, needs to give work 5 days notice, offered another day, patient declines to schedule, prefers for Jinny Blossom to call something in to [facility].

## 2016-07-08 NOTE — Addendum Note (Signed)
Addended by: Noberto Retort C on: 07/08/2016 04:16 PM   Modules accepted: Orders

## 2016-08-06 ENCOUNTER — Ambulatory Visit (INDEPENDENT_AMBULATORY_CARE_PROVIDER_SITE_OTHER): Payer: BC Managed Care – PPO | Admitting: Adult Health

## 2016-08-06 ENCOUNTER — Encounter: Payer: Self-pay | Admitting: Adult Health

## 2016-08-06 VITALS — BP 118/82 | HR 80 | Ht 63.0 in | Wt 257.8 lb

## 2016-08-06 DIAGNOSIS — G43119 Migraine with aura, intractable, without status migrainosus: Secondary | ICD-10-CM

## 2016-08-06 DIAGNOSIS — M542 Cervicalgia: Secondary | ICD-10-CM | POA: Diagnosis not present

## 2016-08-06 MED ORDER — GABAPENTIN 100 MG PO CAPS: 400.0000 mg | ORAL_CAPSULE | Freq: Every day | ORAL | 5 refills | Status: DC

## 2016-08-06 MED ORDER — ONDANSETRON HCL 4 MG PO TABS: 4.0000 mg | ORAL_TABLET | Freq: Three times a day (TID) | ORAL | 5 refills | Status: DC | PRN

## 2016-08-06 NOTE — Patient Instructions (Signed)
Increase gabapentin to 400 mg at bedtime Take Imitrex at the onset of migraine - can repeat in 2 hours if needed Zofran for nausea If your symptoms worsen or you develop new symptoms please let us know.

## 2016-08-06 NOTE — Progress Notes (Signed)
PATIENT: Wanda [name] DOB: 01/08/1971  REASON FOR VISIT: follow up- migraine headaches HISTORY FROM: patient  HISTORY OF PRESENT ILLNESS: Wanda [name] is a 45 year old female with a history of migraine headaches. She returns today for follow-up. She is currently on Topamax 200 mg daily and gabapentin 300 mg at bedtime. She reports that she feels that gabapentin has been beneficial. She states that when she gets headaches they just tend to last for almost a week. Her headaches are always located above the left eye. She does confirm photophobia, phonophobia, nausea and vomiting. She states that she did try Imitrex. She did not take it at the onset of a headache. She states her headache was already severe when she took the medication. She did not notice any benefit. She states that it did make her "feel funny." She states that she did lay down during that headache and woke up with neck pain. She states that she thinks it was due to the CPAP tubing and the way she was laying. She continues to have this neck discomfort (last for 1 month). She returns today for an evaluation.  HISTORY 05/12/16: Wanda [name] is a 45 year old female with a history of migraine headaches. She returns today for follow-up. She continues to take Topamax 100 mg twice a day and Gabapentin 200 mg at bedtime. She reports that initially  increasing gabapentin was beneficial for her headaches however she is now having 7 headaches a month. Her headaches are typically located in the temporal regions. She reports that it typically alternates from the right to left side. When she does have a headache on a scale 1-10 she rates her pain a 10. She denies phonophobia but does state that bright lights affect her head. She does have nausea and vomiting. In the past she has tried nortriptyline but was unable to tolerate it. She is also on CPAP and is followed by Dr.Athar with good compliance. She returns today for an evaluation.  HISTORY 01/10/16: Ms.  [name] Is a 45 year old female with a history of migraine headaches. She returns today for follow-up. She is currently taking Topamax and gabapentin. She states initially the gabapentin was beneficial. Her headache frequency decreased from daily to 5-6 headaches a month. She states that unfortunately in the last week and a half she's developed daily headaches again. Her headaches are located either in the right or left temporal. She does have photophobia and phonophobia and she states for the first time she had a vomiting episode with her headache last week. The patient did have a sleep study and was diagnosed with sleep apnea. She is currently working with a [facility] on getting a CPAP machine. She denies any new neurological symptoms. She returns today for an evaluation.  HISTORY 10/04/15: Wanda [name] is a 45 year old female with a history of migraine headaches. She returns today for follow-up. She continues to take Topamax 200 mg at bedtime. She states that in the last 2 months her headache frequency has not gotten any better. She states that she has approximately 7-8 migraines each month. Her migraines are normally located in the left temporal region. She confirms photophobia and phonophobia but denies nausea and vomiting. At this time the patient would like to try another medication to help with her headaches. She also confirms that she snores and her daughter has witnessed apnea events. She also states that she is sleepy and fatigued during the day and most often will wake up with a headache. She denies ever  having a sleep study. She returns today for an evaluation.  REVIEW OF SYSTEMS: Out of a complete 14 system review of symptoms, the patient complains only of the following symptoms, and all other reviewed systems are negative.  Constipation, neck stiffness, moles  ALLERGIES: No Known Allergies  HOME MEDICATIONS: Outpatient Medications Prior to Visit  Medication Sig Dispense Refill  .  amLODipine (NORVASC) 5 MG tablet TAKE ONE TABLET BY MOUTH EVERY DAY 30 tablet 0  . Ibuprofen (ADVIL MIGRAINE PO) Take by mouth as needed.    . methocarbamol (ROBAXIN) 500 MG tablet Take 1 tablet (500 mg total) by mouth every 6 (six) hours as needed for muscle spasms (or pain). 15 tablet 0  . topiramate (TOPAMAX) 100 MG tablet Take 1 tablet (100 mg total) by mouth 2 (two) times daily. 60 tablet 5  . gabapentin (NEURONTIN) 100 MG capsule Take 3 capsules (300 mg total) by mouth at bedtime. 90 capsule 5  . SUMAtriptan (IMITREX) 25 MG tablet Take 1 tablet (25 mg total) by mouth every 2 (two) hours as needed for migraine. May repeat in 2 hours if headache persists or recurs. Max: 2 tabs/24 hours (Patient not taking: Reported on 08/06/2016) 9 tablet 0   No facility-administered medications prior to visit.     PAST MEDICAL HISTORY: Past Medical History:  Diagnosis Date  . Hypertension   . Migraine   . Obesity     PAST SURGICAL HISTORY: Past Surgical History:  Procedure Laterality Date  . CESAREAN SECTION    . CHOLECYSTECTOMY    . ENDOMETRIAL ABLATION      FAMILY HISTORY: Family History  Problem Relation Age of Onset  . Breast cancer      1st degree relative< 50  . Hypertension    . Kidney disease Brother     dialysis passed away 32 yrs  . Breast cancer Mother   . Cancer Maternal Uncle     Breast cancer  . Cancer Maternal Grandmother     Breast cancer    SOCIAL HISTORY: Social History   Social History  . Marital status: Married    Spouse name: N/A  . Number of children: 3  . Years of education: BA   Occupational History  . [employer]  .  [employer]   Social History Main Topics  . Smoking status: Never Smoker  . Smokeless tobacco: Never Used  . Alcohol use Yes     Comment: wine occassionally  . Drug use: No  . Sexual activity: Not on file   Other Topics Concern  . Not on file   Social History Narrative  . No narrative on file       PHYSICAL EXAM  Vitals:   08/06/16 1530  BP: 118/82  Pulse: 80  Weight: 257 lb 12.8 oz (116.9 kg)  Height: 5' 3"  (1.6 m)   Body mass index is 45.67 kg/m.  Generalized: Well developed, in no acute distress   Neurological examination  Mentation: Alert oriented to time, place, history taking. Follows all commands speech and language fluent Cranial nerve II-XII: Pupils were equal round reactive to light. Extraocular movements were full, visual field were full on confrontational test. Facial sensation and strength were normal. Uvula tongue midline. Head turning and shoulder shrug  were normal and symmetric. Motor: The motor testing reveals 5 over 5 strength of all 4 extremities. Good symmetric motor tone is noted throughout.  Sensory: Sensory testing  is intact to soft touch on all 4 extremities. No evidence of extinction is noted.  Coordination: Cerebellar testing reveals good finger-nose-finger and heel-to-shin bilaterally.  Gait and station: Gait is normal. Tandem gait is normal. Romberg is negative. No drift is seen.  Reflexes: Deep tendon reflexes are symmetric and normal bilaterally.   DIAGNOSTIC DATA (LABS, IMAGING, TESTING) - I reviewed patient records, labs, notes, testing and imaging myself where available.   ASSESSMENT AND PLAN 45 y.o. year old female  has a past medical history of Hypertension; Migraine; and Obesity. here with:  1. Migraine headaches 2. Neck stiffness   The patient will increase gabapentin to 400 mg before bedtime. She will continue on Topamax 200 mg daily. Advised patient that she should try Imitrex at the onset of the headache and can repeat in 2 hours if needed. She voiced understanding. She does have neck stiffness that originated after she fell asleep- perhaps in a weird position. I will order neuromuscular therapy for the patient. Patient advised that if her symptoms worsen or she develops any new symptoms she she'll let us know. Follow-up in 6  months with Dr. Jannifer Franklin.     Ward Givens, MSN, NP-C 08/06/2016, 4:08 PM [facility] [address], [place], [place] [address] [phone]

## 2016-08-06 NOTE — Progress Notes (Signed)
I have read the note, and I agree with the clinical assessment and plan.  Wanda [name]   

## 2016-08-06 NOTE — Progress Notes (Signed)
I called pt and let her know that order placed for neuromuscular therapy.  She should expect a call. She verbalized understanding.

## 2016-08-21 ENCOUNTER — Other Ambulatory Visit: Payer: Self-pay | Admitting: Obstetrics and Gynecology

## 2016-08-21 DIAGNOSIS — N644 Mastodynia: Secondary | ICD-10-CM

## 2016-08-26 ENCOUNTER — Ambulatory Visit
Admission: RE | Admit: 2016-08-26 | Discharge: 2016-08-26 | Disposition: A | Discharge status: Home / Self Care | Payer: BC Managed Care – PPO | Source: Ambulatory Visit | Attending: Obstetrics and Gynecology | Admitting: Obstetrics and Gynecology

## 2016-08-26 DIAGNOSIS — N644 Mastodynia: Secondary | ICD-10-CM

## 2016-08-28 ENCOUNTER — Ambulatory Visit: Payer: BC Managed Care – PPO | Attending: Adult Health | Admitting: Rehabilitative and Restorative Service Providers"

## 2016-08-28 ENCOUNTER — Encounter: Payer: Self-pay | Admitting: Rehabilitative and Restorative Service Providers"

## 2016-08-28 DIAGNOSIS — M542 Cervicalgia: Secondary | ICD-10-CM | POA: Insufficient documentation

## 2016-08-28 DIAGNOSIS — R293 Abnormal posture: Secondary | ICD-10-CM | POA: Diagnosis present

## 2016-08-28 NOTE — Patient Instructions (Signed)
Thoracic Self-Mobilization (Supine)    With rolled towel placed lengthwise at lower ribs level, lie back on towel with arms outstretched. Hold __2 minutes. Relax. Repeat __1__ times per set. Do __1__ sets per session. Do _2___ sessions per day.  http://orth.exer.us/1001   Copyright  VHI. All rights reserved.   Flexors, Supine    Lie on back, head on small, rolled towel. Nod head, tipping chin down. Tighten muscles in back of throat. Hold _5 __ seconds. Repeat _10__ times per session. Do _2__ sessions per day.  Copyright  VHI. All rights reserved.   Shrug (Elevation)    No weight, arms straight. Raise shoulders toward ears. Hold _3__ seconds. Repeat _10__ times. Do _2__ sessions per day.  Copyright  VHI. All rights reserved.   AROM: Neck Rotation    Turn head slowly to look over one shoulder, then the other. Hold each position __3__ seconds. Repeat __10__ times per set. Do __1__ sets per session. Do _2___ sessions per day.  http://orth.exer.us/295   Copyright  VHI. All rights reserved.   Pectoral Stretch, Standing    Stand in doorframe with palms against frame and arms at 90. Step through doorway allowing arms to straighten. Hold _20__ seconds. *can do one at a time. Repeat _2__ times per session. Do __2_ sessions per day.  Copyright  VHI. All rights reserved.

## 2016-08-28 NOTE — Therapy (Signed)
[facility] [address] [place], [place], [address] Phone: [phone]   Fax:  [phone]  Physical Therapy Evaluation  Patient Details  Name: Wanda [name] MRN: [id number] Date of Birth: Apr 20, 1971 Referring Provider: Trixie Deis, NP  Encounter Date: 08/28/2016      PT End of Session - 08/28/16 1414    Visit Number 1   Number of Visits 8   Date for PT Re-Evaluation 09/27/16   Authorization Type Private insurance   PT Start Time 8372   PT Stop Time 1403   PT Time Calculation (min) 46 min   Activity Tolerance Patient tolerated treatment well   Behavior During Therapy [facility] for tasks assessed/performed      Past Medical History:  Diagnosis Date  . Hypertension   . Migraine   . Obesity     Past Surgical History:  Procedure Laterality Date  . CESAREAN SECTION    . CHOLECYSTECTOMY    . ENDOMETRIAL ABLATION      There were no vitals filed for this visit.       Subjective Assessment - 08/28/16 1320    Subjective The patient reports gradual onset of neck pain 2 months ago with worsening difficulty turning her head to the left.  It lasted for 2 full months, but now she can turn her head minimally to the left.  When she performs L rotation, she has pain down her arm (to her elbow), as well.     Pertinent History h/o migraines: usually lasts 4-5 days, stays gone 7-8 days (2-3 per month); photophobia/phonophobia; neck does not hurt worse with migraine.   Patient Stated Goals Get rid of pain in the upper back and the L side of the neck (radiates down arm).   Currently in Pain? Yes   Pain Score 7    Pain Location Back   Pain Orientation Upper   Pain Descriptors / Indicators Aching   Pain Type Acute pain   Pain Onset More than a month ago   Pain Frequency Intermittent   Aggravating Factors  Sitting at the computer (job duties)   Pain Relieving Factors nothing improves   Multiple Pain Sites Yes   Pain  Score 10  was a 10, now is a 4/10   Pain Location Neck   Pain Orientation Left   Pain Descriptors / Indicators Aching   Pain Type Acute pain   Pain Radiating Towards L arm to elbow   Pain Onset More than a month ago   Pain Frequency Constant   Aggravating Factors  turning to the left aggravates   Pain Relieving Factors at rest            [facility] PT Assessment - 08/28/16 1326      Assessment   Medical Diagnosis musculoskeletal neck pain   Referring Provider Trixie Deis, NP   Onset Date/Surgical Date --  2 months ago (05/2016)   Hand Dominance Right   Prior Therapy none     Precautions   Precautions None     Balance Screen   Has the patient fallen in the past 6 months No   Has the patient had a decrease in activity level because of a fear of falling?  No   Is the patient reluctant to leave their home because of a fear of falling?  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  This occurs on both sides (L worse than R)     Posture/Postural Control   Posture/Postural Control Postural limitations     ROM / Strength   AROM / PROM / Strength AROM;Strength     AROM   Overall AROM  Deficits   AROM Assessment Site Shoulder;Cervical   Right/Left Shoulder Right;Left  WFLs bilaterally   Cervical Flexion 15   significant pain in upper back   Cervical Extension 30   Cervical - Right Side Bend 28   Cervical - Left Side Bend 12  significant pain in L side of neck/shoulder   Cervical - Right Rotation 65   Cervical - Left Rotation 42  significant pain L neck     Strength   Overall Strength  Comments 5/5 t/o bilateral shoulder flexion, abduction, 5/5 elbow flexion/extension and equal grip bilaterally.     Flexibility   Soft Tissue Assessment /Muscle Length --  shortening anterior chest muscles     Palpation   Palpation comment trigger points noted in bilateral upper trap, tender to palpation along medial border bilateral scapulae, suboccipital tightness and L cervical tightness/discomfort reported with palpation     Special Tests    Special Tests --  distraction= symptoms remain same                   [facility] Adult PT Treatment/Exercise - 08/28/16 1411      Exercises   Exercises Neck;Shoulder     Neck Exercises: Seated   Cervical Rotation 5 reps   Cervical Rotation Limitations pain with left rotation; recommended within tolerable range   Shoulder Shrugs 10 reps     Neck Exercises: Supine   Neck Retraction 10 reps;5 secs     Shoulder Exercises: Supine   Horizontal ABduction --  towel roll stretch supine for anterior chest muscles     Shoulder Exercises: Standing   Horizontal ABduction --  Door frame stretch anterior pec x 2 reps each     Modalities   Modalities Cryotherapy     Cryotherapy   Number Minutes Cryotherapy 5 Minutes   Cryotherapy Location --  R upper trap/trigger point   Type of Cryotherapy Ice massage  cold probe                     PT Long Term Goals - 08/28/16 1414      PT LONG TERM GOAL #1   Title The patient will be indep with HEP for scapular stabilizers, neck retraction, posture, and flexibility.   Baseline Target date 09/27/2016   Time 4   Period Weeks     PT LONG TERM GOAL #2   Title The patient will report neck disability index improved from 36% to < or equal to 20% to demo dec'd self perception of pain.   Baseline Target date 09/27/2016   Time 4   Period Weeks     PT LONG TERM GOAL #3   Title The patient will improve L rotation from 42 degrees to > or equal to 58degrees for improved ROM.   Baseline  Target date 09/27/2016   Time 4  Period Weeks     PT LONG TERM GOAL #4   Title The patient will improve neck sidebending L from 12 degrees to > or equal to 24 degrees.    Baseline Target date 09/27/2016   Time 4   Period Weeks     PT LONG TERM GOAL #5   Title The patient will perform neck flexion without c/o pain in upper back for improved flexibility/ROM>   Baseline Target date 09/27/2016   Time 4   Period Weeks               Plan - 08/28/16 1427    Clinical Impression Statement The patient is a 45 yo female presenting to OP rehab with neck ROM limitations with L rotation, L sidebending and cervical flexion.   PT to address deficits to improve functional mobility.   Rehab Potential Good   PT Frequency 2x / week   PT Duration 4 weeks   PT Treatment/Interventions ADLs/Self Care Home Management;Cryotherapy;Moist Heat;Traction;Patient/family education;Neuromuscular re-education;Therapeutic exercise;Therapeutic activities;Manual techniques   PT Next Visit Plan Check HEP: supine scapular stabilizers (to keep in good position while contracting muscles), joint mobilization cervical spine, muscle energy for L cervical region, scapular manual techniques.  Anterior chest wall stretching, education on sleeping positions.   Consulted and Agree with Plan of Care Patient      Patient will benefit from skilled therapeutic intervention in order to improve the following deficits and impairments:  Decreased mobility, Hypomobility, Impaired flexibility, Decreased range of motion  Visit Diagnosis: Neck pain  Abnormal posture     Problem List Patient Active Problem List   Diagnosis Date Noted  . HYPERGLYCEMIA 11/18/2010  . URI 08/19/2010  . MIGRAINE HEADACHE 07/09/2010  . HYPOKALEMIA 03/12/2010  . ALLERGIC RHINITIS 03/12/2010  . MORBID OBESITY 08/22/2009  . COMMON MIGRAINE 08/22/2009  . HYPERTENSION 08/22/2009    Mauriah Mcmillen, PT 08/28/2016, 2:32 PM  [facility] [address] [place], [place], [address] Phone: [phone]   Fax:  [phone]  Name: ANICE WILSHIRE MRN: [id number] Date of Birth: [birth year]

## 2016-09-02 ENCOUNTER — Ambulatory Visit: Payer: BC Managed Care – PPO | Admitting: Physical Therapy

## 2016-09-03 ENCOUNTER — Encounter: Payer: Self-pay | Admitting: Neurology

## 2016-09-04 ENCOUNTER — Encounter: Payer: Self-pay | Admitting: Podiatry

## 2016-09-04 ENCOUNTER — Ambulatory Visit (INDEPENDENT_AMBULATORY_CARE_PROVIDER_SITE_OTHER): Payer: BC Managed Care – PPO

## 2016-09-04 ENCOUNTER — Ambulatory Visit (INDEPENDENT_AMBULATORY_CARE_PROVIDER_SITE_OTHER): Payer: BC Managed Care – PPO | Admitting: Podiatry

## 2016-09-04 ENCOUNTER — Ambulatory Visit: Payer: Self-pay

## 2016-09-04 VITALS — BP 136/80 | HR 74 | Resp 16 | Ht 63.0 in | Wt 260.0 lb

## 2016-09-04 DIAGNOSIS — M79672 Pain in left foot: Secondary | ICD-10-CM

## 2016-09-04 DIAGNOSIS — M79671 Pain in right foot: Secondary | ICD-10-CM | POA: Diagnosis not present

## 2016-09-04 DIAGNOSIS — M722 Plantar fascial fibromatosis: Secondary | ICD-10-CM | POA: Diagnosis not present

## 2016-09-04 DIAGNOSIS — M205X9 Other deformities of toe(s) (acquired), unspecified foot: Secondary | ICD-10-CM | POA: Diagnosis not present

## 2016-09-04 MED ORDER — TRIAMCINOLONE ACETONIDE 10 MG/ML IJ SUSP
10.0000 mg | Freq: Once | INTRAMUSCULAR | Status: AC
  Administered 2016-09-04: 10 mg

## 2016-09-04 MED ORDER — DICLOFENAC SODIUM 75 MG PO TBEC
75.0000 mg | DELAYED_RELEASE_TABLET | Freq: Two times a day (BID) | ORAL | 2 refills | Status: DC
Start: 2016-09-04 — End: 2018-12-15

## 2016-09-04 NOTE — Progress Notes (Signed)
   Subjective:    Patient ID: Wanda [name], female    DOB: [birth year], 45 y.o.   MRN: [id number]  HPI  Chief Complaint  Patient presents with  . Foot Pain    bilateral heel pain with the L being the worse..." worst first thing in the a.m.       Review of Systems  All other systems reviewed and are negative.      Objective:   Physical Exam        Assessment & Plan:

## 2016-09-05 ENCOUNTER — Ambulatory Visit: Payer: BC Managed Care – PPO | Attending: Adult Health | Admitting: Rehabilitation

## 2016-09-05 ENCOUNTER — Ambulatory Visit: Payer: BC Managed Care – PPO | Admitting: Physical Therapy

## 2016-09-05 DIAGNOSIS — M542 Cervicalgia: Secondary | ICD-10-CM | POA: Insufficient documentation

## 2016-09-05 DIAGNOSIS — R293 Abnormal posture: Secondary | ICD-10-CM | POA: Insufficient documentation

## 2016-09-07 NOTE — Progress Notes (Signed)
Subjective:     Patient ID: Wanda [name], female   DOB: November 14, 1971, 45 y.o.   MRN: [id number]  HPI patient presents stating I have had a lot of pain in the plantar aspect of my left heel with mild in my right but it is much worse than the left and it's worse when I get up in the morning and after sitting but now hurts all day. States it's been present for several months   Review of Systems  All other systems reviewed and are negative.      Objective:   Physical Exam  Constitutional: She is oriented to person, place, and time.  Cardiovascular: Intact distal pulses.   Musculoskeletal: Normal range of motion.  Neurological: She is oriented to person, place, and time.  Skin: Skin is warm.  Nursing note and vitals reviewed.  neurovascular status intact muscle strength adequate range of motion within normal limits with patient noted to have severe discomfort in the left plantar fashion at the insertional point of the tendon into the calcaneus with inflammation and fluid around the medial band. Patient's noted to have moderate depression of the arch and swelling around the tendon with the right also be mildly tender. Patient's noted to have good digital perfusion and is well oriented 3     Assessment:     Acute plantar fasciitis left with inflammation with mild deformity on the right also noted with moderate depression of the arch    Plan:     H&P x-rays reviewed and condition explained. Today I injected the plantar fascia 3 mg Kenalog 5 mg Xylocaine and applied fascial brace gave instructions on physical therapy and support and discussed long-term orthotics.  X-ray indicates that there is spur formation with no indications of stress fracture or advanced arthritis

## 2016-09-08 ENCOUNTER — Ambulatory Visit (INDEPENDENT_AMBULATORY_CARE_PROVIDER_SITE_OTHER): Payer: BC Managed Care – PPO | Admitting: Neurology

## 2016-09-08 ENCOUNTER — Encounter: Payer: Self-pay | Admitting: Neurology

## 2016-09-08 VITALS — BP 136/88 | HR 78 | Resp 16 | Ht 63.0 in | Wt 264.0 lb

## 2016-09-08 DIAGNOSIS — G4733 Obstructive sleep apnea (adult) (pediatric): Secondary | ICD-10-CM

## 2016-09-08 DIAGNOSIS — R635 Abnormal weight gain: Secondary | ICD-10-CM

## 2016-09-08 DIAGNOSIS — R519 Headache, unspecified: Secondary | ICD-10-CM

## 2016-09-08 DIAGNOSIS — Z9989 Dependence on other enabling machines and devices: Secondary | ICD-10-CM | POA: Diagnosis not present

## 2016-09-08 DIAGNOSIS — R51 Headache: Secondary | ICD-10-CM

## 2016-09-08 NOTE — Patient Instructions (Signed)
Please continue using your CPAP regularly. While your insurance requires that you use CPAP at least 4 hours each night on 70% of the nights, I recommend, that you not skip any nights and use it throughout the night if you can. Getting used to CPAP and staying with the treatment long term does take time and patience and discipline. Untreated obstructive sleep apnea when it is moderate to severe can have an adverse impact on cardiovascular health and raise her risk for heart disease, arrhythmias, hypertension, congestive heart failure, stroke and diabetes. Untreated obstructive sleep apnea causes sleep disruption, nonrestorative sleep, and sleep deprivation. This can have an impact on your day to day functioning and cause daytime sleepiness and impairment of cognitive function, memory loss, mood disturbance, and problems focussing. Using CPAP regularly can improve these symptoms.  Please try to reduce the humidity setting of your machine, as there is too much moisture.   If you continue to see a discrepancy between your usage time and the machine's recording, call [facility] for troubleshooting.   We moved up your appointment with Dr. Jannifer Franklin to Dec.   Try to take your topiramate in 2 doses: 100 mg 2 times a day. May try to reduce the gabapentin to 3 pills per night, if you wake up with more headaches, but I am not sure if it is causing more headaches.

## 2016-09-08 NOTE — Progress Notes (Signed)
Subjective:    Patient ID: Wanda [name] is a 45 y.o. female.  HPI     Interim history:   Wanda [name] is a 45 year old right-handed woman with an underlying medical history of hypertension, migraine headaches and severe obesity, who presents for follow-up consultation of her obstructive sleep apnea, on home CPAP. The patient is unaccompanied today. I last saw her on 03/06/2016, at which time we talked about her sleep study results and her compliance data. She had some issues with the mask and nose dryness but overall indicated improvement of her sleep quality and her morning headaches with CPAP therapy and overall her migraines were little better. I encouraged her to continue to use CPAP therapy every night.   Today, 09/08/2016: I reviewed her CPAP compliance data from 08/05/2016 through 09/03/2016 which is a total of 30 days, during which time she used her machine every night with percent used days greater than 4 hours at 90%, indicating excellent compliance with an average usage of 5 hours and 33 minutes, residual AHI 3.8 per hour, leaked low with the 95th percentile at 4.1 L/m on a pressure of 6 cm with EPR of 3.  Today, 09/08/2016: She reports doing okay, sometimes has morning HAs, has increased the gabapentin to 300 mg in 6/17 and then to 400 mg qHS in July, after calling the office for increase in her HA frequency. Has gained weight, about 20 lb in 6 months. Takes topiramate 200 mg at night. Sometimes she wakes up with too much moisture around her CPAP mask. Sometimes there seems to be a discrepancy between her usage and the machine's tracking of her usage. Currently undergoing physical therapy for her left shoulder pain.   Previously:  Of note, she no showed for an appointment on 02/28/2016. I first met her on 11/06/2015 at the request of Dr. Jannifer Franklin and Ward Givens, at which time the patient reported snoring, witnessed breathing pauses while asleep and excessive daytime somnolence as well  as morning headaches and nocturia. I invited her back for sleep study. She had a baseline sleep study, followed by a CPAP titration study. She had a baseline sleep study on 11/29/2015. Sleep efficiency was 66.7% with a latency to sleep of 138 minutes and wake after sleep onset of 15 minutes. She had an increased percentage of slow-wave sleep at 22% and REM sleep at 27.8%. She had no significant PLMS. She had no significant EEG or EKG changes. Mild to moderate snoring was noted. Supine sleep was not achieved. Total AHI was 7.3 per hour, rising to 22.6 per hour during REM sleep. Average oxygen saturation was 94%, nadir was 86%. Based on her sleep-related complaints I invited her back for a CPAP titration study. She had this on 12/19/2015. Sleep efficiency was 93.7%, latency to sleep was 16 minutes, wake after sleep onset was 9 minutes. She had a normal arousal index. She had a mildly increased percentage of stage II sleep, a mildly decreased percentage of slow-wave sleep, and a mildly increased percentage of REM sleep at 27.8 with a mildly reduced REM latency of 51 minutes. She had no significant PLMS, EKG or EEG changes. CPAP was titrated from 5 cm to 7 cm. Average oxygen saturation was 98%, nadir was 92%. AHI was 0 per hour at a pressure of 6 cm with supine REM sleep achieved. Based on her test results I prescribed CPAP therapy for home use.  I reviewed her CPAP compliance data from 01/28/2016 through 02/26/2016 which is a total  of 30 days during which time she used her machine 30 days with percent used days greater than 4 hours at 100%, indicating superb compliance with an average usage of 8 hours and 2 minutes, residual AHI at 1 per hour, leak low with the 95th percentile at 4.2 L/m on a pressure of 6 cm with EPR of 3.    11/06/2015: She reports snoring, excessive daytime somnolence and witnessed breathing pauses while asleep per daughter and husband. She reports frequent morning headaches and nocturia usually  once or twice per night. She has occasional restless leg symptoms and tends to move her legs at night. She often sleeps alone as her husband travels quite a bit. She lives with her husband and her 62 year old daughter. In addition, she has a 102 year old son and a 6 year old daughter. 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 She is a never smoker. She very occasionally drinks alcohol, she drinks caffeine in the form of coffee or sodas 3-4 per day. She is not aware of any family history of OSA. Her bedtime is around 9:30 or 945 and she watches TV while in bed. She may not fall asleep until after 11 typically. Her rise time is 7 AM. She does not wake up rested. She has had problems trying to lose weight. She feels like she has tried everything. She still has some migraines. I reviewed your office note from 10/04/2015. Her Epworth sleepiness score is 22 out of 24 today, her fatigue score is 55 out of 63.  Her Past Medical History Is Significant For: Past Medical History:  Diagnosis Date  . Hypertension   . Migraine   . Obesity     Her Past Surgical History Is Significant For: Past Surgical History:  Procedure Laterality Date  . CESAREAN SECTION    . CHOLECYSTECTOMY    . ENDOMETRIAL ABLATION      Her Family History Is Significant For: Family History  Problem Relation Age of Onset  . Kidney disease Brother     dialysis passed away 69 yrs  . Breast cancer Mother   . Cancer Maternal Uncle     Breast cancer  . Cancer Maternal Grandmother     Breast cancer  . Breast cancer      1st degree relative< 50  . Hypertension      Her Social History Is Significant For: Social History   Social History  . Marital status: Married    Spouse name: N/A  . Number of children: 3  . Years of education: BA   Occupational History  . [place]  .  [employer]   Social History Main Topics  . Smoking status: Never  Smoker  . Smokeless tobacco: Never Used  . Alcohol use Yes     Comment: wine occassionally  . Drug use: No  . Sexual activity: Not Asked   Other Topics Concern  . None   Social History Narrative  . None   Her Allergies Are:  No Known Allergies:   Her Current Medications Are:  Outpatient Encounter Prescriptions as of 09/08/2016  Medication Sig  . amLODipine (NORVASC) 5 MG tablet TAKE ONE TABLET BY MOUTH EVERY DAY  . diclofenac (VOLTAREN) 75 MG EC tablet Take 1 tablet (75 mg total) by mouth 2 (two) times daily.  Marland Kitchen gabapentin (NEURONTIN) 100 MG capsule Take 4 capsules (400 mg total) by mouth at bedtime.  Marland Kitchen  Ibuprofen (ADVIL MIGRAINE PO) Take by mouth as needed.  . methocarbamol (ROBAXIN) 500 MG tablet Take 1 tablet (500 mg total) by mouth every 6 (six) hours as needed for muscle spasms (or pain).  . SUMAtriptan (IMITREX) 25 MG tablet Take 1 tablet (25 mg total) by mouth every 2 (two) hours as needed for migraine. May repeat in 2 hours if headache persists or recurs. Max: 2 tabs/24 hours  . topiramate (TOPAMAX) 100 MG tablet Take 1 tablet (100 mg total) by mouth 2 (two) times daily.  . [DISCONTINUED] ondansetron (ZOFRAN) 4 MG tablet Take 1 tablet (4 mg total) by mouth every 8 (eight) hours as needed for nausea or vomiting.   No facility-administered encounter medications on file as of 09/08/2016.   :  Review of Systems:  Out of a complete 14 point review of systems, all are reviewed and negative with the exception of these symptoms as listed below:  Review of Systems  Neurological:       Patient feels that her CPAP machine is not working correctly. A couple of times after wearing it all night, it showed that she only used it for 2 hours. In last 2 weeks after she takes off CPAP and sits up she get dizzy.    Objective:  Neurologic Exam  Physical Exam Physical Examination:   Vitals:   09/08/16 1556  BP: 136/88  Pulse: 78  Resp: 16    General Examination: The patient is a  very pleasant 45 y.o. female in no acute distress. She appears well-developed and well-nourished and well groomed. She is in good spirits today.  HEENT: Normocephalic, atraumatic, pupils are equal, round and reactive to light and accommodation. Extraocular tracking is good without limitation to gaze excursion or nystagmus noted. Normal smooth pursuit is noted. Hearing is grossly intact. Face is symmetric with normal facial animation and normal facial sensation. Speech is clear with no dysarthria noted. There is no hypophonia. There is no lip, neck/head, jaw or voice tremor. Neck is supple with full range of passive and active motion. There are no carotid bruits on auscultation. Oropharynx exam reveals: mild mouth dryness, good dental hygiene and moderate airway crowding, due to narrow airway entry, thicker and redundant soft palate and larger uvula as well as slightly elongated tongue. Mallampati is class II. Tongue protrudes centrally and palate elevates symmetrically. Tonsils are 1+ in size.   Chest: Clear to auscultation without wheezing, rhonchi or crackles noted.  Heart: S1+S2+0, regular and normal without murmurs, rubs or gallops noted.   Abdomen: Soft, non-tender and non-distended with normal bowel sounds appreciated on auscultation.  Extremities: There is no pitting edema in the distal lower extremities bilaterally. Pedal pulses are intact.  Skin: Warm and dry without trophic changes noted. There are no varicose veins.  Musculoskeletal: exam reveals no obvious joint deformities, tenderness or joint swelling or erythema, except for L shoulder pain and decrease in ROM.   Neurologically:  Mental status: The patient is awake, alert and oriented in all 4 spheres. Her immediate and remote memory, attention, language skills and fund of knowledge are appropriate. There is no evidence of aphasia, agnosia, apraxia or anomia. Speech is clear with normal prosody and enunciation. Thought process is  linear. Mood is normal and affect is normal.  Cranial nerves II - XII are as described above under HEENT exam. In addition: shoulder shrug is normal with equal shoulder height noted. Motor exam: Normal bulk, strength and tone is noted. There is no drift, tremor or  rebound. Romberg is negative. Reflexes are 2+ throughout. Fine motor skills and coordination: intact in the UEs and LE.  Cerebellar testing: No dysmetria or intention tremor on finger to nose testing. Heel to shin is unremarkable bilaterally. There is no truncal or gait ataxia.  Sensory exam: intact to light touch in the upper and lower extremities.  Gait, station and balance: She stands easily. No veering to one side is noted. No leaning to one side is noted. Posture is age-appropriate and stance is narrow based. Gait shows normal stride length and normal pace. No problems turning are noted. Tandem walk is unremarkable.    Assessment and Plan:   In summary, KARIS RILLING is a very pleasant 45 year old female with an underlying medical history of hypertension, migraine headaches and severe obesity, who presents for follow-up consultation of her obstructive sleep apnea. She has been on CPAP therapy with good compliance and good results. She is commended for her compliance. We talked about her baseline sleep study results from December 2016 as well as her CPAP titration results from January 2017 briefly again today. We also reviewed her compliance data together in detail. Her physical exam is stable except for significant weight gain. She still has recurrent headaches, sometimes morning headaches. She is advised to continue with her medications but try to split up the Topamax to 100 mg twice daily as she has some daytime and morning grogginess from it, in addition she takes gabapentin at night, may be able to scale back on this to 300 mg at night. She is advised to reduce her humidifier setting as she has had some excess moisture in the mask. In  addition, if she continues to have problems with the usage time and a discrepancy between her usage and the machines recording, she is advised to call her DME company to help troubleshoot. From my end of things she is doing well. She is advised to follow-up in one year.  I answered all her questions today and she was in agreement. I spent 25 minutes in total face-to-face time with the patient, more than 50% of which was spent in counseling and coordination of care, reviewing test results, reviewing medication and discussing or reviewing the diagnosis of OSA, its prognosis and treatment options.

## 2016-09-12 ENCOUNTER — Ambulatory Visit: Payer: BC Managed Care – PPO | Admitting: Rehabilitation

## 2016-09-12 ENCOUNTER — Ambulatory Visit: Payer: BC Managed Care – PPO | Admitting: Physical Therapy

## 2016-09-12 ENCOUNTER — Encounter: Payer: Self-pay | Admitting: Rehabilitation

## 2016-09-12 DIAGNOSIS — M542 Cervicalgia: Secondary | ICD-10-CM | POA: Diagnosis not present

## 2016-09-12 DIAGNOSIS — R293 Abnormal posture: Secondary | ICD-10-CM | POA: Diagnosis present

## 2016-09-12 NOTE — Therapy (Signed)
[facility] [address] [place], [place], [address] Phone: [phone]   Fax:  [phone]  Physical Therapy Treatment  Patient Details  Name: Wanda [name] MRN: [id number] Date of Birth: [birth year] Referring Provider: Trixie Deis, NP  Encounter Date: 09/12/2016      PT End of Session - 09/12/16 1307    Visit Number 2   Number of Visits 8   Date for PT Re-Evaluation 09/27/16   Authorization Type Private insurance   PT Start Time 1147   PT Stop Time 1230   PT Time Calculation (min) 43 min   Activity Tolerance Patient tolerated treatment well   Behavior During Therapy [facility] for tasks assessed/performed      Past Medical History:  Diagnosis Date  . Hypertension   . Migraine   . Obesity     Past Surgical History:  Procedure Laterality Date  . CESAREAN SECTION    . CHOLECYSTECTOMY    . ENDOMETRIAL ABLATION      There were no vitals filed for this visit.      Subjective Assessment - 09/12/16 1155    Subjective No changes since last visit, added headache medication   Pertinent History h/o migraines: usually lasts 4-5 days, stays gone 7-8 days (2-3 per month); photophobia/phonophobia; neck does not hurt worse with migraine.   Patient Stated Goals Get rid of pain in the upper back and the L side of the neck (radiates down arm).   Currently in Pain? Yes   Pain Score 4    Pain Location Back   Pain Orientation Upper;Right   Pain Descriptors / Indicators Aching   Pain Type Acute pain   Pain Onset More than a month ago   Pain Frequency Intermittent   Aggravating Factors  sitting at the computer   Pain Relieving Factors stretching only somewhat                         [facility] Adult PT Treatment/Exercise - 09/12/16 0001      Exercises   Exercises Neck;Shoulder     Neck Exercises: Theraband   Shoulder Extension 15 reps;Red     Neck Exercises: Supine   Neck Retraction 10 reps;5 secs      Neck Exercises: Prone   Scapular Retraction 15 reps   Scapular Retraction Limitations cues for technique, but note marked weakness in scapular muscles with decreased ablity to lift UEs vs LEs.    Other Prone Exercise prone alternating UE/LE x 15 reps     Manual Therapy   Manual Therapy Joint mobilization;Soft tissue mobilization;Myofascial release;Manual Traction   Manual therapy comments Performed soft tissue mobilization and myofascial release to B paraspinal and B upper traps.  Trigger point massage to B upper trap region as pt could tolerate.  Gentle manual traction x 2 reps of 2 mins.  Suboccipital release x 2 reps of 1 min each.  while providing soft tissue mobilization, provided light distraction as able to tolerate.  While seated performed joint mobilization at C3/4 (Mobilization with movement) into L cervical rotation to increase ROM with decreased pain x 3 reps.         Self Care:  Went over Social worker and provided handout to allow for proper posture when working to decrease pain.    TE:  Performed doorway stretch x 1 min with encouragement to perform at home in gentle range as she has some burning and tingling in arms with exercise (  feel that it is likely thoracic outlet related).           PT Education - 09/12/16 1307    Education provided Yes   Education Details ergonomics at work station and addition of single exercise, see pt instruction for details.    Person(s) Educated Patient   Methods Explanation;Demonstration;Handout   Comprehension Verbalized understanding;Returned demonstration             PT Long Term Goals - 08/28/16 1414      PT LONG TERM GOAL #1   Title The patient will be indep with HEP for scapular stabilizers, neck retraction, posture, and flexibility.   Baseline Target date 09/27/2016   Time 4   Period Weeks     PT LONG TERM GOAL #2   Title The patient will report neck disability index improved from 36% to < or equal to 20% to  demo dec'd self perception of pain.   Baseline Target date 09/27/2016   Time 4   Period Weeks     PT LONG TERM GOAL #3   Title The patient will improve L rotation from 42 degrees to > or equal to 58degrees for improved ROM.   Baseline Target date 09/27/2016   Time 4   Period Weeks     PT LONG TERM GOAL #4   Title The patient will improve neck sidebending L from 12 degrees to > or equal to 24 degrees.    Baseline Target date 09/27/2016   Time 4   Period Weeks     PT LONG TERM GOAL #5   Title The patient will perform neck flexion without c/o pain in upper back for improved flexibility/ROM>   Baseline Target date 09/27/2016   Time 4   Period Weeks               Plan - 09/12/16 1308    Clinical Impression Statement Skilled session focused on manual therapy to cervical and upper trap region to improve muscle flexibility, decrease pain and improve ROM.  Went over current HEP briefly, however has not been performing doorway stretch.  Performed scapular retraction in prone for improved scapular stablization and added to HEP.     Rehab Potential Good   PT Frequency 2x / week   PT Duration 4 weeks   PT Treatment/Interventions ADLs/Self Care Home Management;Cryotherapy;Moist Heat;Traction;Patient/family education;Neuromuscular re-education;Therapeutic exercise;Therapeutic activities;Manual techniques   PT Next Visit Plan supine scapular stabilizers (to keep in good position while contracting muscles), joint mobilization cervical spine, muscle energy for L cervical region, scapular manual techniques.  Anterior chest wall stretching, education on sleeping positions.   Consulted and Agree with Plan of Care Patient      Patient will benefit from skilled therapeutic intervention in order to improve the following deficits and impairments:  Decreased mobility, Hypomobility, Impaired flexibility, Decreased range of motion  Visit Diagnosis: Neck pain  Abnormal posture     Problem  List Patient Active Problem List   Diagnosis Date Noted  . HYPERGLYCEMIA 11/18/2010  . URI 08/19/2010  . MIGRAINE HEADACHE 07/09/2010  . HYPOKALEMIA 03/12/2010  . ALLERGIC RHINITIS 03/12/2010  . MORBID OBESITY 08/22/2009  . COMMON MIGRAINE 08/22/2009  . HYPERTENSION 08/22/2009    Cameron Sprang, PT, MPT [facility] [address] [place], [place], [address] Phone: [phone]   Fax:  [phone] 09/12/16, 1:28 PM  Name: Wanda [name] MRN: [id number] Date of Birth: Sep 12, 1971

## 2016-09-12 NOTE — Patient Instructions (Addendum)
Computer Work    Position work to Programmer, multimedia, place monitor on book to make at eye level. Use proper work and seat height-keep key board at waist level (so that you aren't cocking wrist up, or slanted down). Keep shoulders back and down, wrists straight, and elbows at right angles. Use chair that provides full back support. Add footrest and lumbar roll as needed.  You may have to add small step stool under your feet so that legs are supported at approx 90 deg angel.     Copyright  VHI. All rights reserved.    Scapular Retraction (Prone)    Lie with arms at sides. Pinch shoulder blades together and raise arms a few inches from floor. Repeat _10___ times per set. Do __1__ sets per session. Do _1___ sessions per day.  http://orth.exer.us/955   Copyright  VHI. All rights reserved.

## 2016-09-16 ENCOUNTER — Ambulatory Visit: Payer: BC Managed Care – PPO | Admitting: Physical Therapy

## 2016-09-17 ENCOUNTER — Ambulatory Visit (INDEPENDENT_AMBULATORY_CARE_PROVIDER_SITE_OTHER): Payer: BC Managed Care – PPO | Admitting: Podiatry

## 2016-09-17 ENCOUNTER — Encounter: Payer: Self-pay | Admitting: Podiatry

## 2016-09-17 DIAGNOSIS — M205X9 Other deformities of toe(s) (acquired), unspecified foot: Secondary | ICD-10-CM

## 2016-09-17 DIAGNOSIS — M722 Plantar fascial fibromatosis: Secondary | ICD-10-CM

## 2016-09-17 DIAGNOSIS — M779 Enthesopathy, unspecified: Secondary | ICD-10-CM

## 2016-09-17 MED ORDER — TRIAMCINOLONE ACETONIDE 10 MG/ML IJ SUSP
10.0000 mg | Freq: Once | INTRAMUSCULAR | Status: AC
  Administered 2016-09-17: 10 mg

## 2016-09-17 NOTE — Progress Notes (Signed)
Subjective:     Patient ID: Wanda [name], female   DOB: [birth year], 45 y.o.   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

## 2016-09-18 ENCOUNTER — Ambulatory Visit: Payer: BC Managed Care – PPO | Admitting: Rehabilitative and Restorative Service Providers"

## 2016-09-18 DIAGNOSIS — R293 Abnormal posture: Secondary | ICD-10-CM

## 2016-09-18 DIAGNOSIS — M542 Cervicalgia: Secondary | ICD-10-CM | POA: Diagnosis not present

## 2016-09-18 NOTE — Patient Instructions (Addendum)
Horizontal Abduction (Resistive Band)    With arms at shoulder level, keep elbows straight DO BOTH SIDES WITH PALMS UP.   Hold _5___ seconds. Repeat __10__ times. Do ___2_ sessions per day.  Copyright  VHI. All rights reserved.   SHOULDER: Diagonal - Up and Away (Band)    Holding band, pull up and out. Keep elbow straight and lead with your thumb to lift at a diagonal.   Hold __3_ seconds. Use ___red_____ band. _10__ reps per set, 2___ sets per day.  Copyright  VHI. All rights reserved.   Side-Lying in Bed    To maintain positioning in midline, place pillows between knees and under head.   Copyright  VHI. All rights reserved.  Sleeping on Back    Place pillow under knees. A pillow with cervical support and a roll around waist are also helpful.   Copyright  VHI. All rights reserved.

## 2016-09-19 ENCOUNTER — Ambulatory Visit: Payer: BC Managed Care – PPO | Admitting: Rehabilitative and Restorative Service Providers"

## 2016-09-19 NOTE — Therapy (Signed)
[facility] [address] [place], [place], [address] Phone: [phone]   Fax:  [phone]  Physical Therapy Treatment  Patient Details  Name: Wanda [name] MRN: [id number] Date of Birth: 1971/08/10 Referring Provider: Trixie Deis, NP  Encounter Date: 09/18/2016      PT End of Session - 09/18/16 1108    Visit Number 3   Number of Visits 8   Date for PT Re-Evaluation 09/27/16   Authorization Type Private insurance   PT Start Time 1107   PT Stop Time 1140   PT Time Calculation (min) 33 min   Activity Tolerance Patient tolerated treatment well   Behavior During Therapy [facility] for tasks assessed/performed      Past Medical History:  Diagnosis Date  . Hypertension   . Migraine   . Obesity     Past Surgical History:  Procedure Laterality Date  . CESAREAN SECTION    . CHOLECYSTECTOMY    . ENDOMETRIAL ABLATION      There were no vitals filed for this visit.      Subjective Assessment - 09/18/16 1107    Subjective The patient reports no neck pain today and no headaches.   Patient notes when sitting in bed looking at computer, her L arm burns/ gets radiating symptoms.   Trying to lie down in bed and get positioned for sleep can increase pain.    Pertinent History h/o migraines: usually lasts 4-5 days, stays gone 7-8 days (2-3 per month); photophobia/phonophobia; neck does not hurt worse with migraine.   Patient Stated Goals Get rid of pain in the upper back and the L side of the neck (radiates down arm).   Currently in Pain? No/denies  2 days without pain                         [facility] Adult PT Treatment/Exercise - 09/18/16 1300      Self-Care   Self-Care Other Self-Care Comments   Other Self-Care Comments  The patient and PT reviewed sleeping positions for sidelying, supine.  Discussed shoulder/arm positioning as this appears to contribute to L shoulder discomfort (when laying prone).      Exercises   Exercises Neck;Shoulder     Neck Exercises: Seated   Other Seated Exercise scapular retraction red theraband seated, x 10 reps with tactile cues on scapular depression.     Neck Exercises: Supine   Neck Retraction 10 reps;5 secs     Shoulder Exercises: Seated   Other Seated Exercises Diagonals red theraband seated x 10 reps R and L sides with tactile cues on technique and scapular muscle activation.      Shoulder Exercises: Standing   Row Strengthening;Right;Left;5 reps   Theraband Level (Shoulder Row) Level 2 (Red)   Row Limitations Patient needed tactile cues for correct technique.  She elevated scapula and performed shoulder IR to compensate for scapular weakness.  PT recommended we not perform and do seated shoulder horizontal abduction.      Shoulder Exercises: Stretch   Other Shoulder Stretches Door frame stretch for anterior chest stretching.  Also used door frame to stretch parascapular muscles.                   PT Education - 09/18/16 1126    Education provided Yes   Education Details CONTINUE  HEP established initial evaluation:  shoulder circles, chin tucks, neck ROM, doorway stretch, towel roll stretch.  Added last visit  prone scapular retraction.  This visit- added scapular retraction,    Person(s) Educated Patient   Methods Explanation;Demonstration;Handout   Comprehension Returned demonstration;Verbalized understanding             PT Long Term Goals - 09/18/16 1114      PT LONG TERM GOAL #1   Title The patient will be indep with HEP for scapular stabilizers, neck retraction, posture, and flexibility.   Baseline Target date 09/27/2016   Time 4   Period Weeks   Status On-going     PT LONG TERM GOAL #2   Title The patient will report neck disability index improved from 36% to < or equal to 20% to demo dec'd self perception of pain.   Baseline Target date 09/27/2016   Time 4   Period Weeks   Status On-going     PT LONG TERM GOAL  #3   Title The patient will improve L rotation from 42 degrees to > or equal to 58degrees for improved ROM.   Baseline Improved to 58 degrees on 09/18/2016.   Time 4   Period Weeks   Status Achieved     PT LONG TERM GOAL #4   Title The patient will improve neck sidebending L from 12 degrees to > or equal to 24 degrees.    Baseline Patient improved to 34 degrees on 09/18/2016   Time 4   Period Weeks   Status Achieved     PT LONG TERM GOAL #5   Title The patient will perform neck flexion without c/o pain in upper back for improved flexibility/ROM>   Baseline Target date 09/27/2016   Time 4   Period Weeks   Status On-going               Plan - 09/19/16 8127    Clinical Impression Statement The patient met 2 LTGs.  She presented today without pain at rest.  She does note continued pain with neck flexion, positioning in the bed while on computer and when sleeping.  PT to continue working towards [place].    PT Treatment/Interventions ADLs/Self Care Home Management;Cryotherapy;Moist Heat;Traction;Patient/family education;Neuromuscular re-education;Therapeutic exercise;Therapeutic activities;Manual techniques   PT Next Visit Plan inquire about additions to HEP, sleeping positions; scapular stabilizer strengthening, cervical spine mobilization, anterior chest wall stretch.   Consulted and Agree with Plan of Care Patient      Patient will benefit from skilled therapeutic intervention in order to improve the following deficits and impairments:  Decreased mobility, Hypomobility, Impaired flexibility, Decreased range of motion  Visit Diagnosis: Neck pain  Abnormal posture     Problem List Patient Active Problem List   Diagnosis Date Noted  . HYPERGLYCEMIA 11/18/2010  . URI 08/19/2010  . MIGRAINE HEADACHE 07/09/2010  . HYPOKALEMIA 03/12/2010  . ALLERGIC RHINITIS 03/12/2010  . MORBID OBESITY 08/22/2009  . COMMON MIGRAINE 08/22/2009  . HYPERTENSION 08/22/2009     Chandler Stofer, PT 09/19/2016, 12:38 PM  [facility] [address] [place], [place], [address] Phone: [phone]   Fax:  [phone]  Name: JERZEY KOMPERDA MRN: [id number] Date of Birth: 1971/08/16

## 2016-09-22 ENCOUNTER — Ambulatory Visit: Payer: BC Managed Care – PPO | Admitting: Rehabilitative and Restorative Service Providers"

## 2016-09-22 DIAGNOSIS — R293 Abnormal posture: Secondary | ICD-10-CM

## 2016-09-22 DIAGNOSIS — M542 Cervicalgia: Secondary | ICD-10-CM

## 2016-09-22 NOTE — Therapy (Signed)
[name] [address] [place], [place], [address] Phone: [phone]   Fax:  [phone]  Physical Therapy Treatment  Patient Details  Name: Wanda [name] MRN: [id number] Date of Birth: March 01, 1971 Referring Provider: Trixie Deis, NP  Encounter Date: 09/22/2016      PT End of Session - 09/22/16 1317    Visit Number 4   Number of Visits 8   Date for PT Re-Evaluation 09/27/16   Authorization Type Private insurance   PT Start Time 1319   PT Stop Time 1340   PT Time Calculation (min) 21 min   Activity Tolerance Patient tolerated treatment well   Behavior During Therapy [facility] for tasks assessed/performed      Past Medical History:  Diagnosis Date  . Hypertension   . Migraine   . Obesity     Past Surgical History:  Procedure Laterality Date  . CESAREAN SECTION    . CHOLECYSTECTOMY    . ENDOMETRIAL ABLATION      There were no vitals filed for this visit.      Subjective Assessment - 09/22/16 1317    Subjective The patient reports no pain at rest and tried the sleeping positions from last time and has been sleeping without pain x the past 3 nights.   Pertinent History h/o migraines: usually lasts 4-5 days, stays gone 7-8 days (2-3 per month); photophobia/phonophobia; neck does not hurt worse with migraine.   Patient Stated Goals Get rid of pain in the upper back and the L side of the neck (radiates down arm).   Currently in Pain? No/denies         THERAPEUTIC EXERCISE: Reviewed all HEP including chest anterior stretch supine and then in doorway, neck retraction supine, seated scapular theraband strengthening horizontal abduction, diagonal scapular retraction, shoulder shrugs, neck AROM and prone scapular depression/stabilization.  The patient has good technique on all HEP and feels she is able to continue working on in the home environment.  Discharge today with HEP.         PT Long Term Goals  - 09/22/16 1322      PT LONG TERM GOAL #1   Title The patient will be indep with HEP for scapular stabilizers, neck retraction, posture, and flexibility.   Baseline Reviewed all HEP and PT provided instructions for post d/c activities.    Time 4   Period Weeks   Status Achieved     PT LONG TERM GOAL #2   Title The patient will report neck disability index improved from 36% to < or equal to 20% to demo dec'd self perception of pain.   Baseline Improved from 36% down to 16% on neck disability index.    Time 4   Period Weeks   Status Achieved     PT LONG TERM GOAL #3   Title The patient will improve L rotation from 42 degrees to > or equal to 58degrees for improved ROM.   Baseline Improved to 58 degrees on 09/18/2016.   Time 4   Period Weeks   Status Achieved     PT LONG TERM GOAL #4   Title The patient will improve neck sidebending L from 12 degrees to > or equal to 24 degrees.    Baseline Patient improved to 34 degrees on 09/18/2016   Time 4   Period Weeks   Status Achieved     PT LONG TERM GOAL #5   Title The patient will perform neck flexion without  c/o pain in upper back for improved flexibility/ROM>   Baseline Met on 09/22/2016   Time 4   Period Weeks   Status Achieved               Plan - 09/22/16 1337    Clinical Impression Statement The patient has met all LTGs and reports no pain at rest and no pain during sleeping.  PT had provided extensive education on body mechanics during work tasks, sleeping positions, and ther ex for scapular and postural stability.  The patient has been very compliant with her home carryover improving her outcomes to meet LTGs early.     PT Next Visit Plan Discharge today with HEP.   Consulted and Agree with Plan of Care Patient      Patient will benefit from skilled therapeutic intervention in order to improve the following deficits and impairments:  Decreased mobility, Hypomobility, Impaired flexibility, Decreased range of  motion  Visit Diagnosis: Neck pain  Abnormal posture     Problem List Patient Active Problem List   Diagnosis Date Noted  . HYPERGLYCEMIA 11/18/2010  . URI 08/19/2010  . MIGRAINE HEADACHE 07/09/2010  . HYPOKALEMIA 03/12/2010  . ALLERGIC RHINITIS 03/12/2010  . MORBID OBESITY 08/22/2009  . COMMON MIGRAINE 08/22/2009  . HYPERTENSION 08/22/2009     Ikeisha Blumberg, PT 09/22/2016, 1:44 PM  [facility] [address] [place], [place], [address] Phone: [phone]   Fax:  [phone]  Name: Wanda [name] MRN: [id number] Date of Birth: Sep 27, 1971

## 2016-09-22 NOTE — Therapy (Signed)
[facility] [address] [place], [place], [address] Phone: [phone]   Fax:  [phone]  Patient Details  Name: Wanda [name] MRN: [id number] Date of Birth: August 04, 1971 Referring Provider:  Nanci Pina, FNP  Encounter Date: 09/22/2016  PHYSICAL THERAPY DISCHARGE SUMMARY  Visits from Start of Care: 4  Current functional level related to goals / functional outcomes:     PT Long Term Goals - 09/22/16 1322      PT LONG TERM GOAL #1   Title The patient will be indep with HEP for scapular stabilizers, neck retraction, posture, and flexibility.   Baseline Reviewed all HEP and PT provided instructions for post d/c activities.    Time 4   Period Weeks   Status Achieved     PT LONG TERM GOAL #2   Title The patient will report neck disability index improved from 36% to < or equal to 20% to demo dec'd self perception of pain.   Baseline Improved from 36% down to 16% on neck disability index.    Time 4   Period Weeks   Status Achieved     PT LONG TERM GOAL #3   Title The patient will improve L rotation from 42 degrees to > or equal to 58degrees for improved ROM.   Baseline Improved to 58 degrees on 09/18/2016.   Time 4   Period Weeks   Status Achieved     PT LONG TERM GOAL #4   Title The patient will improve neck sidebending L from 12 degrees to > or equal to 24 degrees.    Baseline Patient improved to 34 degrees on 09/18/2016   Time 4   Period Weeks   Status Achieved     PT LONG TERM GOAL #5   Title The patient will perform neck flexion without c/o pain in upper back for improved flexibility/ROM>   Baseline Met on 09/22/2016   Time 4   Period Weeks   Status Achieved        Remaining deficits: No pain x past 2 visits, continue with scapular stabilization via HEP   Education / Equipment: HEP, sleeping positions, work space set up.  Plan: Patient agrees to discharge.  Patient goals were met.  Patient is being discharged due to meeting the stated rehab goals.  ?????        Thank you for the referral of this patient. Rudell Cobb, MPT   [name] 09/22/2016, 1:45 PM  [facility] [address] [place], [place], [address] Phone: [phone]   Fax:  [phone]

## 2016-09-24 ENCOUNTER — Ambulatory Visit: Payer: BC Managed Care – PPO | Admitting: Rehabilitative and Restorative Service Providers"

## 2016-09-30 ENCOUNTER — Ambulatory Visit: Payer: BC Managed Care – PPO | Admitting: Rehabilitative and Restorative Service Providers"

## 2016-10-09 ENCOUNTER — Ambulatory Visit (INDEPENDENT_AMBULATORY_CARE_PROVIDER_SITE_OTHER): Payer: BC Managed Care – PPO | Admitting: Podiatry

## 2016-10-09 DIAGNOSIS — M722 Plantar fascial fibromatosis: Secondary | ICD-10-CM

## 2016-10-09 NOTE — Patient Instructions (Signed)

## 2016-10-16 ENCOUNTER — Telehealth: Payer: Self-pay | Admitting: Neurology

## 2016-10-16 MED ORDER — PROMETHAZINE HCL 50 MG PO TABS: ORAL_TABLET | ORAL | 0 refills | Status: DC

## 2016-10-16 NOTE — Telephone Encounter (Signed)
Dr. Rexene Alberts is out this week, but I do not know anything about management of this issue. I will contact Dr. Brett Fairy to see if she can help.

## 2016-10-16 NOTE — Telephone Encounter (Signed)
Patient called to request pressure adjustment of CPAP, states it's causing migraines x 1 week, contacted DME provider who advised patient to contact Dr. Rexene Alberts. Please call [phone].

## 2016-10-16 NOTE — Telephone Encounter (Signed)
Called patient-  I reviewed Dr Guadelupe Sabin notes on this patient: Patient uses 6 cm water pressure , highly compliant and low AHI of 1.0.  Now symptoms of Migraine for over one week: This  may be related to sinus disease or congestion, and she should try to treat the possible underlying conditions.  I can recommend to pause CPAP use for 3-4 days to see if HA respond, but lowering a pressure of only 6 cm water will not make a difference. She agreed with a 3 day break and will use phenergan for nausea and sleep in the meantime. She could not afford Zofran.   CD

## 2016-10-16 NOTE — Telephone Encounter (Signed)
Of course, I will help.

## 2016-11-05 ENCOUNTER — Ambulatory Visit (INDEPENDENT_AMBULATORY_CARE_PROVIDER_SITE_OTHER): Payer: BC Managed Care – PPO | Admitting: Neurology

## 2016-11-05 ENCOUNTER — Encounter: Payer: Self-pay | Admitting: Neurology

## 2016-11-05 VITALS — BP 132/78 | HR 70 | Ht 63.0 in | Wt 268.0 lb

## 2016-11-05 DIAGNOSIS — G43019 Migraine without aura, intractable, without status migrainosus: Secondary | ICD-10-CM | POA: Diagnosis not present

## 2016-11-05 MED ORDER — PROPRANOLOL HCL 20 MG PO TABS: ORAL_TABLET | ORAL | 3 refills | Status: DC

## 2016-11-05 MED ORDER — RIZATRIPTAN BENZOATE 10 MG PO TBDP: 10.0000 mg | ORAL_TABLET | Freq: Three times a day (TID) | ORAL | 3 refills | Status: DC | PRN

## 2016-11-05 NOTE — Progress Notes (Signed)
Reason for visit:  Migraine headache  Wanda [name] is an 45 y.o. female  History of present illness:   Wanda [name] is a 45 year old right-handed black female with a history of obesity, obstructive sleep apnea, and migraine headache. The patient has been placed on CPAP, she has noted that her headaches are generally first thing in the morning. The patient has had about 16 headache days a month on average, she is missing work frequently because of this, often once or twice a week. The patient indicates that she will wake up with the headache in the left frontotemporal area, and if she does not get medication rapidly she will have a more severe and longer lasting headache. In the past, a course of prednisone has been helpful. She has not had any headaches over the last 2 weeks, but in general the headaches are much more frequent than this. She has been on Topamax, 100 milligrams twice daily, she could not tolerate nortriptyline in the past, and she is currently on 400 mg of gabapentin in the evenings without benefit. She has taken Imitrex previously but this gave her chest pressure and made her feel bad, she had to stop the medication. Fioricet in the past was used without much benefit. She may take Advil if needed. The patient does have nausea and vomiting with the headache, and some photophobia and phonophobia.  Past Medical History:  Diagnosis Date  . Hypertension   . Migraine   . Obesity     Past Surgical History:  Procedure Laterality Date  . CESAREAN SECTION    . CHOLECYSTECTOMY    . ENDOMETRIAL ABLATION      Family History  Problem Relation Age of Onset  . Kidney disease Brother     dialysis passed away 21 yrs  . Breast cancer Mother   . Cancer Maternal Uncle     Breast cancer  . Cancer Maternal Grandmother     Breast cancer  . Breast cancer      1st degree relative< 50  . Hypertension      Social history:  reports that she has never smoked. She has never used smokeless  tobacco. She reports that she drinks alcohol. She reports that she does not use drugs.   No Known Allergies  Medications:  Prior to Admission medications   Medication Sig Start Date End Date Taking? Authorizing Provider  amLODipine (NORVASC) 5 MG tablet TAKE ONE TABLET BY MOUTH EVERY DAY 07/17/11  Yes Alferd Apa Lowne Chase, DO  BYSTOLIC 5 MG tablet  87/8/67  Yes Historical Provider, MD  diclofenac (VOLTAREN) 75 MG EC tablet Take 1 tablet (75 mg total) by mouth 2 (two) times daily. 09/04/16  Yes Tamala Fothergill Regal, DPM  gabapentin (NEURONTIN) 100 MG capsule Take 4 capsules (400 mg total) by mouth at bedtime. 08/06/16  Yes Ward Givens, NP  Ibuprofen (ADVIL MIGRAINE PO) Take by mouth as needed.   Yes Historical Provider, MD  methocarbamol (ROBAXIN) 500 MG tablet Take 1 tablet (500 mg total) by mouth every 6 (six) hours as needed for muscle spasms (or pain). 02/04/15  Yes Clayton Bibles, PA-C  promethazine (PHENERGAN) 50 MG tablet Use prn up to 3 times a day. Take prn 1 tab at night (sleepy making !) You may use 1/2 tab up to 3 times in daytime , beware of drowsiness. 10/16/16  Yes Carmen Dohmeier, MD  SUMAtriptan (IMITREX) 25 MG tablet Take 1 tablet (25 mg total) by mouth every 2 (two)  hours as needed for migraine. May repeat in 2 hours if headache persists or recurs. Max: 2 tabs/24 hours 07/08/16  Yes Ward Givens, NP  topiramate (TOPAMAX) 100 MG tablet Take 1 tablet (100 mg total) by mouth 2 (two) times daily. 01/10/16  Yes Ward Givens, NP  valACYclovir (VALTREX) 500 MG tablet  10/25/16  Yes Historical Provider, MD  AMITIZA 8 MCG capsule  09/20/16   Historical Provider, MD    ROS:  Out of a complete 14 system review of symptoms, the patient complains only of the following symptoms, and all other reviewed systems are negative.   Migraine headache  Blood pressure 132/78, pulse 70, height 5' 3"  (1.6 m), weight 268 lb (121.6 kg).  Physical Exam  General: The patient is alert and cooperative at the time  of the examination. The patient is markedly obese.  Skin: No significant peripheral edema is noted.   Neurologic Exam  Mental status: The patient is alert and oriented x 3 at the time of the examination. The patient has apparent normal recent and remote memory, with an apparently normal attention span and concentration ability.   Cranial nerves: Facial symmetry is present. Speech is normal, no aphasia or dysarthria is noted. Extraocular movements are full. Visual fields are full.  Motor: The patient has good strength in all 4 extremities.  Sensory examination: Soft touch sensation is symmetric on the face, arms, and legs.  Coordination: The patient has good finger-nose-finger and heel-to-shin bilaterally.  Gait and station: The patient has a normal gait. Tandem gait is normal. Romberg is negative. No drift is seen.  Reflexes: Deep tendon reflexes are symmetric.   Assessment/Plan:   1. Intractable migraine headache   2. Obstructive sleep apnea on CPAP   The patient is doing poorly with her headaches. The Topamax is at a 200 mg daily dosing level, and is not effective in controlling her headaches, we will taper her down on the medication. She will go to 150 mg daily for 2 weeks, then go to 100 mg daily. She will be placed on propranolol at 20 mg twice daily for 2 weeks then go to 40 mg twice daily. The patient will be taken off of Imitrex and placed on Maxalt to take if needed for the headache. She will follow-up in 3 months. If no significant improvement is noted, we may consider the patient for Botox treatments. The patient will stop the Bystolic while on propranolol.  Jill Alexanders MD 11/05/2016 4:47 PM  [facility] [address] [place], [place] [address]  Phone [phone] Fax [phone]

## 2016-11-05 NOTE — Patient Instructions (Signed)
   With the Topamax, start 1/2 tablet in the morning and 1 in the evening for 2 weeks, then take only one tablet at night.  We will start propranolol 20 mg tablets, and use Maxalt if needed for the headache. Call for any dose adjustments.   Inderal (propranolol) is a blood pressure medication that is commonly used for migraine headaches. This is a type of beta blocker. The most common side effects include low heart rate, dizziness, fatigue, and increased depression. This medication may worsen asthma. If you believe that you are having side effects on this medication, please contact our office.

## 2016-11-11 ENCOUNTER — Ambulatory Visit: Payer: BC Managed Care – PPO | Admitting: Adult Health

## 2016-12-10 ENCOUNTER — Other Ambulatory Visit: Payer: Self-pay | Admitting: Obstetrics and Gynecology

## 2016-12-10 DIAGNOSIS — Z1231 Encounter for screening mammogram for malignant neoplasm of breast: Secondary | ICD-10-CM

## 2016-12-23 ENCOUNTER — Ambulatory Visit
Admission: RE | Admit: 2016-12-23 | Discharge: 2016-12-23 | Disposition: A | Discharge status: Home / Self Care | Payer: BC Managed Care – PPO | Source: Ambulatory Visit | Attending: Obstetrics and Gynecology | Admitting: Obstetrics and Gynecology

## 2016-12-23 DIAGNOSIS — Z1231 Encounter for screening mammogram for malignant neoplasm of breast: Secondary | ICD-10-CM

## 2017-02-03 ENCOUNTER — Ambulatory Visit: Payer: BC Managed Care – PPO | Admitting: Neurology

## 2017-02-04 ENCOUNTER — Ambulatory Visit: Payer: BC Managed Care – PPO | Admitting: Adult Health

## 2017-03-19 ENCOUNTER — Ambulatory Visit: Payer: BC Managed Care – PPO | Admitting: Adult Health

## 2017-05-17 ENCOUNTER — Emergency Department (HOSPITAL_COMMUNITY): Payer: BC Managed Care – PPO

## 2017-05-17 ENCOUNTER — Emergency Department (HOSPITAL_COMMUNITY)
Admission: EM | Admit: 2017-05-17 | Discharge: 2017-05-17 | Disposition: A | Discharge status: Home / Self Care | Payer: BC Managed Care – PPO | Attending: Emergency Medicine | Admitting: Emergency Medicine

## 2017-05-17 ENCOUNTER — Encounter (HOSPITAL_COMMUNITY): Payer: Self-pay

## 2017-05-17 DIAGNOSIS — Z79899 Other long term (current) drug therapy: Secondary | ICD-10-CM | POA: Insufficient documentation

## 2017-05-17 DIAGNOSIS — I10 Essential (primary) hypertension: Secondary | ICD-10-CM | POA: Diagnosis not present

## 2017-05-17 DIAGNOSIS — M25561 Pain in right knee: Secondary | ICD-10-CM | POA: Diagnosis not present

## 2017-05-17 DIAGNOSIS — Z5321 Procedure and treatment not carried out due to patient leaving prior to being seen by health care provider: Secondary | ICD-10-CM | POA: Insufficient documentation

## 2017-05-17 DIAGNOSIS — M79601 Pain in right arm: Secondary | ICD-10-CM | POA: Insufficient documentation

## 2017-05-17 NOTE — ED Notes (Signed)
Pt called for room x2

## 2017-05-17 NOTE — ED Notes (Signed)
No response after being called.  Removed from system at this time.

## 2017-05-17 NOTE — ED Triage Notes (Signed)
Pt complaining of R arm and R knee pain. Pt states woke up in bed with pain. Pt denies any injury/trauma. Pt states increased pain with motion.

## 2017-08-10 ENCOUNTER — Encounter: Payer: Self-pay | Admitting: Neurology

## 2017-09-08 ENCOUNTER — Ambulatory Visit: Payer: BC Managed Care – PPO | Admitting: Neurology

## 2017-11-12 ENCOUNTER — Other Ambulatory Visit: Payer: Self-pay | Admitting: Neurology

## 2018-01-05 ENCOUNTER — Other Ambulatory Visit: Payer: Self-pay | Admitting: Obstetrics and Gynecology

## 2018-01-05 DIAGNOSIS — Z9189 Other specified personal risk factors, not elsewhere classified: Secondary | ICD-10-CM

## 2018-01-23 ENCOUNTER — Ambulatory Visit
Admission: RE | Admit: 2018-01-23 | Discharge: 2018-01-23 | Disposition: A | Discharge status: Home / Self Care | Payer: BC Managed Care – PPO | Source: Ambulatory Visit | Attending: Obstetrics and Gynecology | Admitting: Obstetrics and Gynecology

## 2018-01-23 DIAGNOSIS — Z9189 Other specified personal risk factors, not elsewhere classified: Secondary | ICD-10-CM

## 2018-07-07 ENCOUNTER — Encounter: Payer: Self-pay | Admitting: Adult Health

## 2018-09-24 ENCOUNTER — Emergency Department (HOSPITAL_COMMUNITY)
Admission: EM | Admit: 2018-09-24 | Discharge: 2018-09-25 | Disposition: A | Discharge status: Home / Self Care | Payer: BC Managed Care – PPO | Attending: Emergency Medicine | Admitting: Emergency Medicine

## 2018-09-24 ENCOUNTER — Other Ambulatory Visit: Payer: Self-pay

## 2018-09-24 ENCOUNTER — Emergency Department (HOSPITAL_COMMUNITY): Payer: BC Managed Care – PPO

## 2018-09-24 ENCOUNTER — Encounter (HOSPITAL_COMMUNITY): Payer: Self-pay | Admitting: Emergency Medicine

## 2018-09-24 DIAGNOSIS — R079 Chest pain, unspecified: Secondary | ICD-10-CM | POA: Diagnosis present

## 2018-09-24 DIAGNOSIS — Z79899 Other long term (current) drug therapy: Secondary | ICD-10-CM | POA: Diagnosis not present

## 2018-09-24 DIAGNOSIS — I10 Essential (primary) hypertension: Secondary | ICD-10-CM | POA: Insufficient documentation

## 2018-09-24 DIAGNOSIS — R42 Dizziness and giddiness: Secondary | ICD-10-CM | POA: Insufficient documentation

## 2018-09-24 LAB — CBC
HCT: 46.8 % — ABNORMAL HIGH (ref 36.0–46.0)
HEMOGLOBIN: 14.4 g/dL (ref 12.0–15.0)
MCH: 26.2 pg (ref 26.0–34.0)
MCHC: 30.8 g/dL (ref 30.0–36.0)
MCV: 85.2 fL (ref 80.0–100.0)
NRBC: 0 % (ref 0.0–0.2)
Platelets: 358 10*3/uL (ref 150–400)
RBC: 5.49 MIL/uL — ABNORMAL HIGH (ref 3.87–5.11)
RDW: 14.4 % (ref 11.5–15.5)
WBC: 8.1 10*3/uL (ref 4.0–10.5)

## 2018-09-24 LAB — BASIC METABOLIC PANEL
ANION GAP: 8 (ref 5–15)
BUN: <5 mg/dL — ABNORMAL LOW (ref 6–20)
CO2: 27 mmol/L (ref 22–32)
Calcium: 9.5 mg/dL (ref 8.9–10.3)
Chloride: 104 mmol/L (ref 98–111)
Creatinine, Ser: 0.86 mg/dL (ref 0.44–1.00)
GFR calc non Af Amer: >60 mL/min (ref >60)
Glucose, Bld: 136 mg/dL — ABNORMAL HIGH (ref 70–99)
POTASSIUM: 3.5 mmol/L (ref 3.5–5.1)
SODIUM: 139 mmol/L (ref 135–145)

## 2018-09-24 LAB — I-STAT TROPONIN, ED: Troponin i, poc: 0.01 ng/mL (ref 0.00–0.08)

## 2018-09-24 LAB — I-STAT BETA HCG BLOOD, ED (MC, WL, AP ONLY)

## 2018-09-24 NOTE — ED Triage Notes (Signed)
Pt reports chest pain and dizziness, L arm pain and numbness since Wednesday. Reports pain is worse with inspiration, states she "feels like I'm smoking 10 packs a day" but reports that she does not smoke. Denies cough.

## 2018-09-24 NOTE — ED Notes (Signed)
Results reviewed, no changes in acuity at this time 

## 2018-09-25 LAB — I-STAT TROPONIN, ED: Troponin i, poc: 0 ng/mL (ref 0.00–0.08)

## 2018-09-25 LAB — D-DIMER, QUANTITATIVE: D-Dimer, Quant: 0.27 ug/mL-FEU (ref 0.00–0.50)

## 2018-09-25 MED ORDER — NAPROXEN 500 MG PO TABS: 500.0000 mg | ORAL_TABLET | Freq: Two times a day (BID) | ORAL | 0 refills | Status: DC

## 2018-09-25 MED ORDER — HYDROCODONE-ACETAMINOPHEN 5-325 MG PO TABS: 1.0000 | ORAL_TABLET | ORAL | 0 refills | Status: DC | PRN

## 2018-09-25 NOTE — ED Notes (Signed)
Patient left at this time with all belongings. 

## 2018-09-25 NOTE — ED Provider Notes (Signed)
[facility] EMERGENCY DEPARTMENT Provider Note   CSN: [id number] Arrival date & time: 09/24/18  1911     History   Chief Complaint Chief Complaint  Patient presents with  . Chest Pain  . Dizziness    HPI Wanda [name] is a 47 y.o. female.  The history is provided by the patient and medical records.  Chest Pain   Associated symptoms include dizziness.  Dizziness  Associated symptoms: chest pain      47 y.o. F with hx of HTN, migraine headaches, obesity, presenting to the ED for chest pain.  States this started on Wednesday has been persistent since that time.  She reports pain is worse in the mid-sternal region but chest has not felt tender to the touch.  Pain worse with lying flat and deep breathing, better with sitting upright.  She denies SOB, but states she has been breathing more shallow than normal due to pain with deep breathing.  She has also had some left arm pain, states it is an odd sensation, very tingling.  No focal weakness or numbness.  No neck/jaw pain.  No fever, chills, cough, sore throat, sick contacts, or other URI symptoms.  No known cardiac history.  No significant family history. She is not a smoker.  Not currently on exogenous estrogens.  Recent returned from [place], was there for a conference.  5 hour flight each way.  No issues during flight.  Denies leg or calf pain.  No hx of DVT or PE.    Past Medical History:  Diagnosis Date  . Hypertension   . Migraine   . Obesity     Patient Active Problem List   Diagnosis Date Noted  . HYPERGLYCEMIA 11/18/2010  . URI 08/19/2010  . MIGRAINE HEADACHE 07/09/2010  . HYPOKALEMIA 03/12/2010  . ALLERGIC RHINITIS 03/12/2010  . MORBID OBESITY 08/22/2009  . Migraine without aura 08/22/2009  . HYPERTENSION 08/22/2009    Past Surgical History:  Procedure Laterality Date  . CESAREAN SECTION    . CHOLECYSTECTOMY    . ENDOMETRIAL ABLATION       OB History   None      Home  Medications    Prior to Admission medications   Medication Sig Start Date End Date Taking? Authorizing Provider  AMITIZA 8 MCG capsule  09/20/16   [provider]  amLODipine (NORVASC) 5 MG tablet TAKE ONE TABLET BY MOUTH EVERY DAY 07/17/11   Carollee Herter, Alferd Apa, DO  BYSTOLIC 5 MG tablet  96/2/22   [provider]  diclofenac (VOLTAREN) 75 MG EC tablet Take 1 tablet (75 mg total) by mouth 2 (two) times daily. 09/04/16   Wallene Huh, DPM  gabapentin (NEURONTIN) 100 MG capsule Take 4 capsules (400 mg total) by mouth at bedtime. 08/06/16   Ward Givens, NP  Ibuprofen (ADVIL MIGRAINE PO) Take by mouth as needed.    [provider]  methocarbamol (ROBAXIN) 500 MG tablet Take 1 tablet (500 mg total) by mouth every 6 (six) hours as needed for muscle spasms (or pain). 02/04/15   Clayton Bibles, PA-C  promethazine (PHENERGAN) 50 MG tablet Use prn up to 3 times a day. Take prn 1 tab at night (sleepy making !) You may use 1/2 tab up to 3 times in daytime , beware of drowsiness. 10/16/16   Dohmeier, Asencion Partridge, MD  propranolol (INDERAL) 20 MG tablet One tablet twice a day for 2 weeks, then take 2 tablets twice a day 11/05/16  Kathrynn Ducking, MD  rizatriptan (MAXALT-MLT) 10 MG disintegrating tablet Take 1 tablet (10 mg total) by mouth 3 (three) times daily as needed for migraine. 11/05/16   Kathrynn Ducking, MD  topiramate (TOPAMAX) 100 MG tablet Take 1 tablet (100 mg total) by mouth 2 (two) times daily. 01/10/16   Ward Givens, NP  valACYclovir (VALTREX) 500 MG tablet  10/25/16   [provider]    Family History Family History  Problem Relation Age of Onset  . Kidney disease Brother        dialysis passed away 65 yrs  . Breast cancer Mother   . Cancer Maternal Uncle        Breast cancer  . Cancer Maternal Grandmother        Breast cancer  . Breast cancer Unknown        1st degree relative< 50  . Hypertension Unknown     Social History Social History    Tobacco Use  . Smoking status: Never Smoker  . Smokeless tobacco: Never Used  Substance Use Topics  . Alcohol use: Yes    Comment: wine occassionally  . Drug use: No     Allergies   Patient has no known allergies.   Review of Systems Review of Systems  Cardiovascular: Positive for chest pain.  Neurological: Positive for dizziness.  All other systems reviewed and are negative.    Physical Exam Updated Vital Signs BP 126/90   Pulse 72   Temp 98 F (36.7 C) (Oral)   Resp 18   Ht 5' 3"  (1.6 m)   Wt 118.4 kg   SpO2 100%   BMI 46.23 kg/m   Physical Exam  Constitutional: She is oriented to person, place, and time. She appears well-developed and well-nourished.  HENT:  Head: Normocephalic and atraumatic.  Mouth/Throat: Oropharynx is clear and moist.  Eyes: Pupils are equal, round, and reactive to light. Conjunctivae and EOM are normal.  Neck: Normal range of motion.  Cardiovascular: Normal rate, regular rhythm and normal heart sounds.  Pulmonary/Chest: Effort normal and breath sounds normal. She has no decreased breath sounds. She has no wheezes.  Abdominal: Soft. Bowel sounds are normal.  Musculoskeletal: Normal range of motion.  No peripheral edema, no calf pain, swelling, tenderness, or palpable cords; negative homan's sign  Neurological: She is alert and oriented to person, place, and time.  Skin: Skin is warm and dry.  Psychiatric: She has a normal mood and affect.  Nursing note and vitals reviewed.    ED Treatments / Results  Labs (all labs ordered are listed, but only abnormal results are displayed) Labs Reviewed  BASIC METABOLIC PANEL - Abnormal; Notable for the following components:      Result Value   Glucose, Bld 136 (*)    BUN <5 (*)    All other components within normal limits  CBC - Abnormal; Notable for the following components:   RBC 5.49 (*)    HCT 46.8 (*)    All other components within normal limits  D-DIMER, QUANTITATIVE (NOT AT [facility])   I-STAT TROPONIN, ED  I-STAT BETA HCG BLOOD, ED ([facility], [facility], [facility] ONLY)  I-STAT TROPONIN, ED    EKG EKG Interpretation  Date/Time:  Friday September 24 2018 19:15:47 EDT Ventricular Rate:  81 PR Interval:  180 QRS Duration: 82 QT Interval:  392 QTC Calculation: 455 R Axis:   87 Text Interpretation:  Poor data quality, interpretation may be adversely affected Normal sinus rhythm Nonspecific T wave  abnormality Abnormal ECG since last tracing no significant change Confirmed by Malvin Johns [phone]) on 09/25/2018 1:46:10 AM   Radiology Dg Chest 2 View  Result Date: 09/24/2018 CLINICAL DATA:  47 year old female with chest pain and dizziness. Inspiratory pain. EXAM: CHEST - 2 VIEW COMPARISON:  Chest radiographs 07/15/2013. FINDINGS: Stable low normal lung volumes. Mediastinal contours remain normal. Visualized tracheal air column is within normal limits. No pleural effusion, pneumothorax or confluent pulmonary opacity. Mild increased pulmonary interstitial markings. No pleural fluid in the fissures. Stable cholecystectomy clips. Negative visible bowel gas pattern. No acute osseous abnormality identified. IMPRESSION: Mild nonspecific increased pulmonary interstitial markings compared to 2014. No pleural fluid is evident, but consider interstitial edema and viral/atypical respiratory infection. Electronically Signed   By: Genevie Ann M.D.   On: 09/24/2018 19:57    Procedures Procedures (including critical care time)  Medications Ordered in ED Medications - No data to display   Initial Impression / Assessment and Plan / ED Course  I have reviewed the triage vital signs and the nursing notes.  Pertinent labs & imaging results that were available during my care of the patient were reviewed by me and considered in my medical decision making (see chart for details).  47 year old female presenting to the ED with chest pain.  Has been ongoing since Wednesday.  Pain is worse with lying flat and deep  breathing.  She has not had any shortness of breath, diaphoresis, neck pain, dizziness, weakness.  She did recently return from [place] after conference.  No history of DVT or PE.  She is afebrile and nontoxic in appearance.  EKG is nonischemic.  Labs overall reassuring, troponin negative.  Patient's symptoms are somewhat atypical, does raise suspicion for possible PE given her recent travel and pleuritic nature.  D-dimer was sent and is negative.  Delta troponin also negative.  Patient has been resting comfortably, vitals are stable.  At this time given negative evaluation after several days of ongoing symptoms, low suspicion for ACS, PE, dissection, or other acute cardiac event.  Pain is somewhat positional, however no diffuse ST elevations to suggest pericarditis.  She remains afebrile, non-toxic.  Feel she is stable for discharge.  Will have her start anti-inflammatories.  She will follow-up closely with PCP and return here for any new/acute changes.  Final Clinical Impressions(s) / ED Diagnoses   Final diagnoses:  Chest pain in adult    ED Discharge Orders         Ordered    naproxen (NAPROSYN) 500 MG tablet  2 times daily with meals     09/25/18 0339    HYDROcodone-acetaminophen (NORCO/VICODIN) 5-325 MG tablet  Every 4 hours PRN     09/25/18 0339           Larene Pickett, PA-C 09/25/18 [id number]    Malvin Johns, MD 09/25/18 [phone]

## 2018-09-25 NOTE — Discharge Instructions (Signed)
Your cardiac tests today looked great. Follow-up with your primary care doctor.  Have them review labs/imaging from today's visit. Return here for any new/acute changes-- worsening pain, shortness of breath, etc.

## 2018-12-15 ENCOUNTER — Encounter: Payer: Self-pay | Admitting: *Deleted

## 2018-12-15 ENCOUNTER — Encounter: Payer: Self-pay | Admitting: Adult Health

## 2018-12-15 ENCOUNTER — Ambulatory Visit: Payer: BC Managed Care – PPO | Admitting: Adult Health

## 2018-12-15 VITALS — BP 121/88 | HR 72 | Ht 63.0 in | Wt 264.0 lb

## 2018-12-15 DIAGNOSIS — G43019 Migraine without aura, intractable, without status migrainosus: Secondary | ICD-10-CM

## 2018-12-15 DIAGNOSIS — Z9989 Dependence on other enabling machines and devices: Secondary | ICD-10-CM

## 2018-12-15 DIAGNOSIS — G4733 Obstructive sleep apnea (adult) (pediatric): Secondary | ICD-10-CM | POA: Diagnosis not present

## 2018-12-15 MED ORDER — ERENUMAB-AOOE 140 MG/ML ~~LOC~~ SOAJ: 140.0000 mg | SUBCUTANEOUS | 5 refills | Status: DC

## 2018-12-15 MED ORDER — DICLOFENAC POTASSIUM(MIGRAINE) 50 MG PO PACK: PACK | ORAL | 5 refills | Status: DC

## 2018-12-15 NOTE — Progress Notes (Signed)
I have read the note, and I agree with the clinical assessment and plan.  Merdith Boyd K Betheny Suchecki   

## 2018-12-15 NOTE — Progress Notes (Signed)
PATIENT: Wanda [name] DOB: 08-08-1971  REASON FOR VISIT: follow up HISTORY FROM: patient  HISTORY OF PRESENT ILLNESS: Today 12/15/18:  Wanda [name] is a 48 year old female with a history of obstructive sleep apnea and migraine headaches.  She returns today for follow-up.  She has been lost to follow-up for the last 2 years.  She states that she is no longer on any medication for her headaches.  She is no longer using the CPAP machine.  She states that her headaches have come back.  She has approximately 18 headache days a month.  She states that she typically will get 3 headaches that last 6 to 7 days.  Her headaches can typically occur in the temporal regions or in the neck.  She does have photophobia and phonophobia as well as nausea.  She reports on occasion she will have vomiting episodes.  She typically takes [facility] or Goody powders.  She is taking up to 6 a day.  In the past she has tried Topamax, nortriptyline, gabapentin, propranolol, Fioricet, Maxalt, Imitrex and Imitrex nasal spray.  She states that she stopped using her CPAP machine because water was getting in the mask.  She reports that she called her DME company they offered her no solution.  She returns today for evaluation.  HISTORY Wanda [name] is a 48 year old right-handed black female with a history of obesity, obstructive sleep apnea, and migraine headache. The patient has been placed on CPAP, she has noted that her headaches are generally first thing in the morning. The patient has had about 16 headache days a month on average, she is missing work frequently because of this, often once or twice a week. The patient indicates that she will wake up with the headache in the left frontotemporal area, and if she does not get medication rapidly she will have a more severe and longer lasting headache. In the past, a course of prednisone has been helpful. She has not had any headaches over the last 2 weeks, but in general the headaches are much  more frequent than this. She has been on Topamax, 100 milligrams twice daily, she could not tolerate nortriptyline in the past, and she is currently on 400 mg of gabapentin in the evenings without benefit. She has taken Imitrex previously but this gave her chest pressure and made her feel bad, she had to stop the medication. Fioricet in the past was used without much benefit. She may take Advil if needed. The patient does have nausea and vomiting with the headache, and some photophobia and phonophobia.   REVIEW OF SYSTEMS: Out of a complete 14 system review of symptoms, the patient complains only of the following symptoms, and all other reviewed systems are negative.  Constipation, insomnia, apnea, frequent waking, daytime sleepiness  ALLERGIES: No Known Allergies  HOME MEDICATIONS: Outpatient Medications Prior to Visit  Medication Sig Dispense Refill  . amLODipine (NORVASC) 5 MG tablet TAKE ONE TABLET BY MOUTH EVERY DAY 30 tablet 0  . hydrochlorothiazide (HYDRODIURIL) 12.5 MG tablet Take 12.5 mg by mouth daily.    . AMITIZA 8 MCG capsule     . BYSTOLIC 5 MG tablet     . diclofenac (VOLTAREN) 75 MG EC tablet Take 1 tablet (75 mg total) by mouth 2 (two) times daily. (Patient not taking: Reported on 12/15/2018) 50 tablet 2  . gabapentin (NEURONTIN) 100 MG capsule Take 4 capsules (400 mg total) by mouth at bedtime. (Patient not taking: Reported on 12/15/2018) 120 capsule 5  .  HYDROcodone-acetaminophen (NORCO/VICODIN) 5-325 MG tablet Take 1 tablet by mouth every 4 (four) hours as needed. (Patient not taking: Reported on 12/15/2018) 10 tablet 0  . Ibuprofen (ADVIL MIGRAINE PO) Take by mouth as needed.    . methocarbamol (ROBAXIN) 500 MG tablet Take 1 tablet (500 mg total) by mouth every 6 (six) hours as needed for muscle spasms (or pain). (Patient not taking: Reported on 12/15/2018) 15 tablet 0  . naproxen (NAPROSYN) 500 MG tablet Take 1 tablet (500 mg total) by mouth 2 (two) times daily with a meal.  (Patient not taking: Reported on 12/15/2018) 30 tablet 0  . promethazine (PHENERGAN) 50 MG tablet Use prn up to 3 times a day. Take prn 1 tab at night (sleepy making !) You may use 1/2 tab up to 3 times in daytime , beware of drowsiness. (Patient not taking: Reported on 12/15/2018) 30 tablet 0  . propranolol (INDERAL) 20 MG tablet One tablet twice a day for 2 weeks, then take 2 tablets twice a day (Patient not taking: Reported on 12/15/2018) 120 tablet 3  . rizatriptan (MAXALT-MLT) 10 MG disintegrating tablet Take 1 tablet (10 mg total) by mouth 3 (three) times daily as needed for migraine. (Patient not taking: Reported on 12/15/2018) 9 tablet 3  . topiramate (TOPAMAX) 100 MG tablet Take 1 tablet (100 mg total) by mouth 2 (two) times daily. (Patient not taking: Reported on 12/15/2018) 60 tablet 5  . valACYclovir (VALTREX) 500 MG tablet      No facility-administered medications prior to visit.     PAST MEDICAL HISTORY: Past Medical History:  Diagnosis Date  . Hypertension   . Migraine   . Obesity     PAST SURGICAL HISTORY: Past Surgical History:  Procedure Laterality Date  . CESAREAN SECTION    . CHOLECYSTECTOMY    . ENDOMETRIAL ABLATION      FAMILY HISTORY: Family History  Problem Relation Age of Onset  . Kidney disease Brother        dialysis passed away 60 yrs  . Breast cancer Mother   . Cancer Maternal Uncle        Breast cancer  . Cancer Maternal Grandmother        Breast cancer  . Breast cancer Unknown        1st degree relative< 50  . Hypertension Unknown     SOCIAL HISTORY: Social History   Socioeconomic History  . Marital status: Married    Spouse name: Not on file  . Number of children: 3  . Years of education: BA  . Highest education level: Not on file  Occupational History  . Occupation: [employer]  . Occupation: Aeronautical engineer: [employer] Education administrator: [place]  Social Needs  . Financial resource strain: Not on file  . Food insecurity:      Worry: Not on file    Inability: Not on file  . Transportation needs:    Medical: Not on file    Non-medical: Not on file  Tobacco Use  . Smoking status: Never Smoker  . Smokeless tobacco: Never Used  Substance and Sexual Activity  . Alcohol use: Yes    Comment: wine occassionally  . Drug use: No  . Sexual activity: Not on file  Lifestyle  . Physical activity:    Days per week: Not on file    Minutes per session: Not on file  . Stress: Not on file  Relationships  .  Social connections:    Talks on phone: Not on file    Gets together: Not on file    Attends religious service: Not on file    Active member of club or organization: Not on file    Attends meetings of clubs or organizations: Not on file    Relationship status: Not on file  . Intimate partner violence:    Fear of current or ex partner: Not on file    Emotionally abused: Not on file    Physically abused: Not on file    Forced sexual activity: Not on file  Other Topics Concern  . Not on file  Social History Narrative   Lives at home w/ her dog   Right-handed   Caffeine: rare      PHYSICAL EXAM  Vitals:   12/15/18 0849  BP: 121/88  Pulse: 72  Weight: 264 lb (119.7 kg)  Height: 5' 3"  (1.6 m)   Body mass index is 46.77 kg/m.  Generalized: Well developed, in no acute distress   Neurological examination  Mentation: Alert oriented to time, place, history taking. Follows all commands speech and language fluent Cranial nerve II-XII: Pupils were equal round reactive to light. Extraocular movements were full, visual field were full on confrontational test. Facial sensation and strength were normal. Uvula tongue midline. Head turning and shoulder shrug  were normal and symmetric. Motor: The motor testing reveals 5 over 5 strength of all 4 extremities. Good symmetric motor tone is noted throughout.  Sensory: Sensory testing is intact to soft touch on all 4 extremities. No evidence of extinction is noted.   Coordination: Cerebellar testing reveals good finger-nose-finger and heel-to-shin bilaterally.  Gait and station: Gait is normal.  Reflexes: Deep tendon reflexes are symmetric and normal bilaterally.   DIAGNOSTIC DATA (LABS, IMAGING, TESTING) - I reviewed patient records, labs, notes, testing and imaging myself where available.  Lab Results  Component Value Date   WBC 8.1 09/24/2018   HGB 14.4 09/24/2018   HCT 46.8 (H) 09/24/2018   MCV 85.2 09/24/2018   PLT 358 09/24/2018      Component Value Date/Time   NA 139 09/24/2018 1932   K 3.5 09/24/2018 1932   CL 104 09/24/2018 1932   CO2 27 09/24/2018 1932   GLUCOSE 136 (H) 09/24/2018 1932   BUN <5 (L) 09/24/2018 1932   CREATININE 0.86 09/24/2018 1932   CALCIUM 9.5 09/24/2018 1932   PROT 7.5 12/22/2012 1854   ALBUMIN 3.6 12/22/2012 1854   AST 19 12/22/2012 1854   ALT 12 12/22/2012 1854   ALKPHOS 65 12/22/2012 1854   BILITOT 0.4 12/22/2012 1854   GFRNONAA >60 09/24/2018 1932   GFRAA >60 09/24/2018 1932   Lab Results  Component Value Date   CHOL 142 02/06/2010   HDL 48.10 02/06/2010   LDLCALC 82 02/06/2010   TRIG 61.0 02/06/2010   CHOLHDL 3 02/06/2010   Lab Results  Component Value Date   HGBA1C 6.3 11/18/2010   No results found for: VITAMINB12 Lab Results  Component Value Date   TSH 1.00 08/22/2009      ASSESSMENT AND PLAN 48 y.o. year old female  has a past medical history of Hypertension, Migraine, and Obesity. here with:  1. Intractable migraine headache 2.  Obstructive sleep apnea on CPAP  The patient has tried multiple oral medications in the past with little benefit.  We discussed starting Aimovig.  She is amenable to this plan.  I have reviewed potential side effects with the  patient.  She will also be given a prescription of Cambia to use when she gets an acute headache.  I have reviewed potential side effects of Cambia.  Also advised that this should not be taken daily.  She was also advised to  discontinue her use of Goody powders and BC powder.  I did advise that she should try to refrain from using Cambia in conjunction with over-the-counter medication such as ibuprofen etc.  Patient will be restarted on CPAP.  I have sent an order over to her DME company asking that they look at her machine to determine why water is in her mask.  She is advised that if her symptoms do not improve she will let us know.  She will follow-up in 6 months or sooner if needed.     Ward Givens, MSN, NP-C 12/15/2018, 8:58 AM [facility] [address] [place], [place] [address] [phone]

## 2018-12-15 NOTE — Patient Instructions (Signed)
Your Plan:  Start Aimovig for headache prevention. Cambia ordered. Can take at the onset of a migraine. NOT meant to be taken daily.  Restart CPAP therapy If your symptoms worsen or you develop new symptoms please let us know.   Thank you for coming to see [place] at [facility]. I hope we have been able to provide you high quality care today.  You may receive a patient satisfaction survey over the next few weeks. We would appreciate your feedback and comments so that we may continue to improve ourselves and the health of our patients.  Diclofenac powder for oral solution What is this medicine? DICLOFENAC (dye KLOE fen ak) is a non-steroidal anti-inflammatory drug (NSAID). It is used to treat migraine pain. This medicine may be used for other purposes; ask your health care provider or pharmacist if you have questions. COMMON BRAND NAME(S): Cambia What should I tell my health care provider before I take this medicine? They need to know if you have any of these conditions: -asthma, especially aspirin sensitive asthma -coronary artery bypass graft (CABG) surgery within the past 2 weeks -drink more than 3 alcohol-containing drinks a day -heart disease or circulation problems like heart failure or leg edema (fluid retention) -high blood pressure -kidney disease -liver disease -phenylketonuria -stomach problems -an unusual or allergic reaction to diclofenac, aspirin, other NSAIDs, other medicines, foods, dyes, or preservatives -pregnant or trying to get pregnant -breast-feeding How should I use this medicine? Mix this medicine with 1 to 2 ounces of water. Drink the medicine and water together. Follow the directions on the prescription label. Do not take your medicine more often than directed. Long-term, continuous use may increase the risk of heart attack or stroke. A special MedGuide will be given to you by the pharmacist with each prescription and refill. Be sure to read  this information carefully each time. Talk to your pediatrician regarding the use of this medicine in children. Special care may be needed. Elderly patients over 67 years old may have a stronger reaction and need a smaller dose. Overdosage: If you think you have taken too much of this medicine contact a poison control center or emergency room at once. NOTE: This medicine is only for you. Do not share this medicine with others. What if I miss a dose? This does not apply. What may interact with this medicine? Do not take this medicine with any of the following medications: -cidofovir -ketorolac -methotrexate This medicine may also interact with the following medications: -alcohol -aspirin and aspirin-like medicines -cyclosporine -diuretics -lithium -medicines for blood pressure -medicines for osteoporosis -medicines that affect platelets -medicines that treat or prevent blood clots like warfarin -NSAIDs, medicines for pain and inflammation, like ibuprofen or naproxen -pemetrexed -steroid medicines like prednisone or cortisone This list may not describe all possible interactions. Give your health care provider a list of all the medicines, herbs, non-prescription drugs, or dietary supplements you use. Also tell them if you smoke, drink alcohol, or use illegal drugs. Some items may interact with your medicine. What should I watch for while using this medicine? Tell your doctor or health care professional if your pain does not get better. Talk to your doctor before taking another medicine for pain. Do not treat yourself. This medicine does not prevent heart attack or stroke. In fact, this medicine may increase the chance of a heart attack or stroke. The chance may increase with longer use of this medicine and in people who have heart disease. If you  take aspirin to prevent heart attack or stroke, talk with your doctor or health care professional. Do not take medicines such as ibuprofen and  naproxen with this medicine. Side effects such as stomach upset, nausea, or ulcers may be more likely to occur. Many medicines available without a prescription should not be taken with this medicine. This medicine can cause ulcers and bleeding in the stomach and intestines at any time during treatment. Do not smoke cigarettes or drink alcohol. These increase irritation to your stomach and can make it more susceptible to damage from this medicine. Ulcers and bleeding can happen without warning symptoms and can cause death. You may get drowsy or dizzy. Do not drive, use machinery, or do anything that needs mental alertness until you know how this medicine affects you. Do not stand or sit up quickly, especially if you are an older patient. This reduces the risk of dizzy or fainting spells. This medicine can cause you to bleed more easily. Try to avoid damage to your teeth and gums when you brush or floss your teeth. If you take migraine medicines for 10 or more days a month, your migraines may get worse. Keep a diary of headache days and medicine use. Contact your healthcare professional if your migraine attacks occur more frequently. What side effects may I notice from receiving this medicine? Side effects that you should report to your doctor or health care professional as soon as possible: -allergic reactions like skin rash, itching or hives, swelling of the face, lips, or tongue -black or bloody stools, blood in the urine or vomit -blurred vision -chest pain -difficulty breathing or wheezing -nausea or vomiting -fever -redness, blistering, peeling or loosening of the skin, including inside the mouth -slurred speech or weakness on one side of the body -trouble passing urine or change in the amount of urine -unexplained weight gain or swelling -unusually weak or tired -yellowing of eyes or skin Side effects that usually do not require medical attention (report to your doctor or health care  professional if they continue or are bothersome): -constipation -diarrhea -dizziness -headache -heartburn This list may not describe all possible side effects. Call your doctor for medical advice about side effects. You may report side effects to FDA at 1-800-FDA-1088. Where should I keep my medicine? Keep out of the reach of children. Store at room temperature between 15 and 30 degrees C (59 and 86 degrees F). Throw away any unused medicine after the expiration date. NOTE: This sheet is a summary. It may not cover all possible information. If you have questions about this medicine, talk to your doctor, pharmacist, or health care provider.  2019 Elsevier/Gold Standard (2015-12-20 09:56:49)

## 2018-12-15 NOTE — Progress Notes (Signed)
Community message sent via Epic to [facility] re: restart CPAP, check machine.

## 2018-12-16 ENCOUNTER — Telehealth: Payer: Self-pay | Admitting: *Deleted

## 2018-12-16 ENCOUNTER — Encounter: Payer: Self-pay | Admitting: *Deleted

## 2018-12-16 NOTE — Telephone Encounter (Signed)
We will hold off on starting anything else at this time. We will see if Aimovig decreasing her headaches.

## 2018-12-16 NOTE — Telephone Encounter (Signed)
Called [facility], spoke with Apolonio Schneiders to inquire about [place] PA. Apolonio Schneiders stated a PA is on file, but the pharmacy needs to call Help Desk and change NDC code. She stated that is all that is needed. I called [facility], spoke with Inez Catalina. I first advised her Aimovig is approved. She ran it through it will cost  $30 month. I then advised her of message re: Cambia. She took numbers for pharmacy help desk, stated she would call. The patient is set up for automatic texts from [place] when a Rx is ready to be picked up.  I thanked her for her assistance.

## 2018-12-16 NOTE — Telephone Encounter (Signed)
Started PA for Aimovig on CMM, key: [id number]. Tried/failed: nortriptyline, gabapentin, topamax, imitrex, Fioricet. Dx : W09.811 Your information has been submitted to [place]. To check for an updated outcome later, reopen this PA request from your dashboard. If [facility] has not responded to your request within 24 hours, contact [place] at [phone].

## 2018-12-16 NOTE — Telephone Encounter (Signed)
Started PA for [employer] on CMM, key : [id number], dx : M08.022. tried /failed/side effects: Imitrex, topamax, nortriptyline, gabapentin, fioricet.

## 2018-12-16 NOTE — Telephone Encounter (Signed)
Received decision, Cambia denied. Patient must try/fail formulary alternatives: eletriptan, ibuprofen, naratriptan, rizatriptan, sumatriptan, zolmitriptan, Onzetra Xsail, Zembrace Symtouch, Zomig nasal spray. Will send to NP to advise.

## 2018-12-16 NOTE — Telephone Encounter (Signed)
[facility]- Aimovig approved: 12/16/18 - 03/17/19.

## 2018-12-16 NOTE — Telephone Encounter (Signed)
LVM advising the patient I was trying to reach her about medications. I sent her a my chart message with details.

## 2019-01-25 ENCOUNTER — Ambulatory Visit: Payer: Self-pay | Admitting: Cardiology

## 2019-02-17 ENCOUNTER — Encounter: Payer: Self-pay | Admitting: Gastroenterology

## 2019-03-22 NOTE — Telephone Encounter (Signed)
Reauthorization for aimovig 165m/ml.  Initiated [place]  Uploaded last ofv note and initial authorization for aimovig to [id number]  ([facility]

## 2019-03-23 NOTE — Telephone Encounter (Signed)
[facility] approval [facility]ig 11m [facility]ml [facility]  From 03-22-19 thru 03-21-20.  [phone]  Fax confirmation received [facility] [phone]

## 2019-05-18 ENCOUNTER — Telehealth: Payer: Self-pay | Admitting: Adult Health

## 2019-05-18 NOTE — Telephone Encounter (Signed)
Pt states she has had a migraine since Friday and nothing is working to alleviate it. Pt would like to be advised on what else she can do. Please advise.

## 2019-05-19 NOTE — Telephone Encounter (Signed)
Spoke with patient who stated she's not had Aimovig since April, [facility] was out of it until yesterday. She stated she didn't think it helped her anyway.  She never got [place]; insurance didn't approve it. She has taken Advil Migraine, Ibuprofen, Tylenol without relief.  Her migraine "comes and goes, and she has a lot of nausea. I advised will send message to NP and call her back.  Patient verbalized understanding, appreciation.

## 2019-05-19 NOTE — Telephone Encounter (Signed)
Spoke with patient and informed her that Jinny Blossom would like either a video visit or in office visit to further discuss with her. She stated she has to talk to her supervisor tomorrow and look at possible dates. She will call back to schedule. Patient verbalized understanding, appreciation.

## 2019-05-19 NOTE — Telephone Encounter (Signed)
Please schedule either video or in office visit to discuss

## 2019-06-20 ENCOUNTER — Other Ambulatory Visit: Payer: Self-pay

## 2019-06-20 ENCOUNTER — Encounter: Payer: Self-pay | Admitting: Adult Health

## 2019-06-20 ENCOUNTER — Ambulatory Visit: Payer: BC Managed Care – PPO | Admitting: Adult Health

## 2019-06-20 VITALS — BP 116/82 | HR 65 | Temp 97.8°F | Ht 63.0 in | Wt 264.4 lb

## 2019-06-20 DIAGNOSIS — G4733 Obstructive sleep apnea (adult) (pediatric): Secondary | ICD-10-CM

## 2019-06-20 DIAGNOSIS — Z9989 Dependence on other enabling machines and devices: Secondary | ICD-10-CM | POA: Diagnosis not present

## 2019-06-20 DIAGNOSIS — G43019 Migraine without aura, intractable, without status migrainosus: Secondary | ICD-10-CM

## 2019-06-20 MED ORDER — AJOVY 225 MG/1.5ML ~~LOC~~ SOAJ: 225.0000 mg | SUBCUTANEOUS | 5 refills | Status: DC

## 2019-06-20 NOTE — Progress Notes (Signed)
PATIENT: Wanda [name] DOB: [birth year]  REASON FOR VISIT: follow up HISTORY FROM: patient  HISTORY OF PRESENT ILLNESS: Today 06/20/19:  Wanda [name] is a 48 year old female with a history of obstructive sleep apnea and migraine headaches.  She returns today for follow-up.  At the last visit she was prescribed Aimovig.  She states that the pharmacy "ran out of the medication in April."  She never restarted it.  She states that she does not feel that it was helping much.  She is having approximately 10 headaches a month.  Her headaches typically occur in the right frontal region and radiates to the occipital region.  She does report photophobia and phonophobia.  She does report nausea but no vomiting.  She states that she is taking a Goody powder every other day.  In the past she has tried Topamax, nortriptyline, gabapentin, propranolol, Fioricet, Maxalt, Imitrex and Imitrex nasal spray.  She is still not using her CPAP machine.  She returns today for follow-up.  HISTORY 12/15/18:  Wanda [name] is a 48 year old female with a history of obstructive sleep apnea and migraine headaches.  She returns today for follow-up.  She has been lost to follow-up for the last 2 years.  She states that she is no longer on any medication for her headaches.  She is no longer using the CPAP machine.  She states that her headaches have come back.  She has approximately 18 headache days a month.  She states that she typically will get 3 headaches that last 6 to 7 days.  Her headaches can typically occur in the temporal regions or in the neck.  She does have photophobia and phonophobia as well as nausea.  She reports on occasion she will have vomiting episodes.  She typically takes [facility] or Goody powders.  She is taking up to 6 a day.  In the past she has tried Topamax, nortriptyline, gabapentin, propranolol, Fioricet, Maxalt, Imitrex and Imitrex nasal spray.  She states that she stopped using her CPAP machine because water was  getting in the mask.  She reports that she called her DME company they offered her no solution.  She returns today for evaluation.  REVIEW OF SYSTEMS: Out of a complete 14 system review of symptoms, the patient complains only of the following symptoms, and all other reviewed systems are negative.  See HPI  ALLERGIES: Allergies  Allergen Reactions  . Imitrex [Sumatriptan] Other (See Comments)    "felt bad, had chest pressure"    HOME MEDICATIONS: Outpatient Medications Prior to Visit  Medication Sig Dispense Refill  . amLODipine (NORVASC) 5 MG tablet TAKE ONE TABLET BY MOUTH EVERY DAY 30 tablet 0  . Diclofenac Potassium (CAMBIA) 50 MG PACK Take 50 mg at the onset of migraine. 1 each 5  . Erenumab-aooe (AIMOVIG) 140 MG/ML SOAJ Inject 140 mg into the skin every 30 (thirty) days. 1 mL 5  . hydrochlorothiazide (HYDRODIURIL) 12.5 MG tablet Take 12.5 mg by mouth daily.    . Ibuprofen (ADVIL MIGRAINE PO) Take by mouth as needed.    . valACYclovir (VALTREX) 500 MG tablet      No facility-administered medications prior to visit.     PAST MEDICAL HISTORY: Past Medical History:  Diagnosis Date  . Hypertension   . Migraine   . Obesity     PAST SURGICAL HISTORY: Past Surgical History:  Procedure Laterality Date  . CESAREAN SECTION    . CHOLECYSTECTOMY    . ENDOMETRIAL ABLATION  FAMILY HISTORY: Family History  Problem Relation Age of Onset  . Kidney disease Brother        dialysis passed away 59 yrs  . Breast cancer Mother   . Cancer Maternal Uncle        Breast cancer  . Cancer Maternal Grandmother        Breast cancer  . Breast cancer Unknown        1st degree relative< 50  . Hypertension Unknown     SOCIAL HISTORY: Social History   Socioeconomic History  . Marital status: Married    Spouse name: Not on file  . Number of children: 3  . Years of education: BA  . Highest education level: Not on file  Occupational History  . Occupation: [employer]  . 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 Financial resource strain: Not on file  . Food insecurity    Worry: Not on file    Inability: Not on file  . Transportation needs    Medical: Not on file    Non-medical: Not on file  Tobacco Use  . Smoking status: Never Smoker  . Smokeless tobacco: Never Used  Substance and Sexual Activity  . Alcohol use: Yes    Comment: wine occassionally  . Drug use: No  . Sexual activity: Not on file  Lifestyle  . Physical activity    Days per week: Not on file    Minutes per session: Not on file  . Stress: Not on file  Relationships  . Social Herbalist on phone: Not on file    Gets together: Not on file    Attends religious service: Not on file    Active member of club or organization: Not on file    Attends meetings of clubs or organizations: Not on file    Relationship status: Not on file  . Intimate partner violence    Fear of current or ex partner: Not on file    Emotionally abused: Not on file    Physically abused: Not on file    Forced sexual activity: Not on file  Other Topics Concern  . Not on file  Social History Narrative   Lives at home w/ her dog   Right-handed   Caffeine: rare      PHYSICAL EXAM  Vitals:   06/20/19 0748  BP: 116/82  Pulse: 65  Temp: 97.8 F (36.6 C)  Weight: 264 lb 6.4 oz (119.9 kg)  Height: 5' 3"  (1.6 m)   Body mass index is 46.84 kg/m.  Generalized: Well developed, in no acute distress   Neurological examination  Mentation: Alert oriented to time, place, history taking. Follows all commands speech and language fluent Cranial nerve II-XII: Pupils were equal round reactive to light. Extraocular movements were full, visual field were full on confrontational test. Facial sensation and strength were normal. Uvula tongue midline. Head turning and shoulder shrug  were normal and symmetric. Motor: The motor testing reveals 5 over 5 strength of  all 4 extremities. Good symmetric motor tone is noted throughout.  Sensory: Sensory testing is intact to soft touch on all 4 extremities. No evidence of extinction is noted.  Coordination: Cerebellar testing reveals good finger-nose-finger and heel-to-shin bilaterally.  Gait and station: Gait is normal.  Reflexes: Deep tendon reflexes are symmetric and normal bilaterally.   DIAGNOSTIC DATA (LABS, IMAGING, TESTING) -  I reviewed patient records, labs, notes, testing and imaging myself where available.  Lab Results  Component Value Date   WBC 8.1 09/24/2018   HGB 14.4 09/24/2018   HCT 46.8 (H) 09/24/2018   MCV 85.2 09/24/2018   PLT 358 09/24/2018      Component Value Date/Time   NA 139 09/24/2018 1932   K 3.5 09/24/2018 1932   CL 104 09/24/2018 1932   CO2 27 09/24/2018 1932   GLUCOSE 136 (H) 09/24/2018 1932   BUN <5 (L) 09/24/2018 1932   CREATININE 0.86 09/24/2018 1932   CALCIUM 9.5 09/24/2018 1932   PROT 7.5 12/22/2012 1854   ALBUMIN 3.6 12/22/2012 1854   AST 19 12/22/2012 1854   ALT 12 12/22/2012 1854   ALKPHOS 65 12/22/2012 1854   BILITOT 0.4 12/22/2012 1854   GFRNONAA >60 09/24/2018 1932   GFRAA >60 09/24/2018 1932   Lab Results  Component Value Date   CHOL 142 02/06/2010   HDL 48.10 02/06/2010   LDLCALC 82 02/06/2010   TRIG 61.0 02/06/2010   CHOLHDL 3 02/06/2010   Lab Results  Component Value Date   HGBA1C 6.3 11/18/2010   No results found for: VITAMINB12 Lab Results  Component Value Date   TSH 1.00 08/22/2009      ASSESSMENT AND PLAN 48 y.o. year old female  has a past medical history of Hypertension, Migraine, and Obesity. here with :  1.  Migraine headaches 2.  Obstructive sleep apnea  The patient will be started on Ajovy to see if this offers any benefit for her headaches.  She is instructed to stop using Goody powders as this may be causing rebound headaches.  We also discussed restarting the CPAP.  Untreated sleep apnea may also be contributing to  her headaches.  Patient voiced understanding.  An order will be sent to her DME company to restart.  The patient is advised that if her symptoms worsen or she develops new symptoms she should let us know.  She will follow-up in 3 to 4 months with a video visit.     Ward Givens, MSN, NP-C 06/20/2019, 8:43 AM [facility] [address], [place], [place] [address] [phone]

## 2019-06-20 NOTE — Progress Notes (Signed)
[facility] received new cpap orders.  sy

## 2019-06-20 NOTE — Patient Instructions (Signed)
Your Plan:  Start Ajovy Restart CPAP If your symptoms worsen or you develop new symptoms please let us know.    Thank you for coming to see [place] at [facility]. I hope we have been able to provide you high quality care today.  You may receive a patient satisfaction survey over the next few weeks. We would appreciate your feedback and comments so that we may continue to improve ourselves and the health of our patients.

## 2019-06-20 NOTE — Progress Notes (Signed)
I have read the note, and I agree with the clinical assessment and plan.  Mithran Strike K Infant Doane   

## 2019-06-21 ENCOUNTER — Telehealth: Payer: Self-pay

## 2019-06-21 NOTE — Telephone Encounter (Signed)
PA done for Ajovy 233m/1.5ml on cover my meds. Decision pending.

## 2019-06-22 NOTE — Telephone Encounter (Signed)
PA approve for Ajovy 263m1.5ml auto injector from 06/21/2019 to 09/21/2019. Customer service number is [phone]

## 2019-09-20 ENCOUNTER — Telehealth (INDEPENDENT_AMBULATORY_CARE_PROVIDER_SITE_OTHER): Payer: BC Managed Care – PPO | Admitting: Adult Health

## 2019-09-20 DIAGNOSIS — G4733 Obstructive sleep apnea (adult) (pediatric): Secondary | ICD-10-CM | POA: Diagnosis not present

## 2019-09-20 DIAGNOSIS — G43709 Chronic migraine without aura, not intractable, without status migrainosus: Secondary | ICD-10-CM | POA: Diagnosis not present

## 2019-09-20 DIAGNOSIS — Z9989 Dependence on other enabling machines and devices: Secondary | ICD-10-CM

## 2019-09-20 NOTE — Progress Notes (Signed)
  [facility] [address] [place]. [place] [address] [phone]     Virtual Visit via Telephone Note  I connected with Wanda [name] on 09/20/19 at  2:30 PM EDT by telephone located remotely at [facility] and verified that I am speaking with the correct person using two identifiers who reports being located at home.   Visit scheduled by NP. I discussed the limitations, risks, security and privacy concerns of performing an evaluation and management service by telephone and the availability of in person appointments. I also discussed with the patient that there may be a patient responsible charge related to this service. The patient expressed understanding and agreed to proceed. See telephone note for consent and additional scheduling information.    History of Present Illness:  Wanda [name] is a 48 y.o. female who has been followed in this office for migraine headaches and obstructive sleep apnea on CPAP.  She was initially scheduled for a video visit however her camera was not working so she was transitioned to a telephone visit.  She reports that since she started Ajovy her headaches have decreased to 3-4 times a month.  They still occur above the left eye and in the temporal region.  She reports photophobia and phonophobia.  She reports that she has only had nausea with one headache since she started this medication.  She is not taking any over-the-counter medication.  She states with her headaches she tries to lay down and use an ice pack.  Her CPAP download indicates that she restarted using the CPAP in July for approximately 1 week but then used it only periodically and August, even less in September and not at all in October.  She states that she was sick in August with a cold and was not able to use it.  She returns today for evaluation.   Observations/Objective:    Neurological examination  Mentation: Alert oriented to time, place,  history taking. Follows all commands speech and language fluent  Assessment and Plan:  1: Migraine headaches 2.  Obstructive sleep apnea on CPAP  The patient's headaches have improved since she started Ajovy.  We will continue this medication.  The patient is still not using the CPAP consistently.  She is encouraged to restart the CPAP and use it nightly.  I have reviewed potential risk associated with untreated sleep apnea.  She voiced understanding.   Follow Up Instructions:   F/U 6 months    I discussed the assessment and treatment plan with the patient.  The patient was provided an opportunity to ask questions and all were answered to their satisfaction. The patient agreed with the plan and verbalized an understanding of the instructions.   I provided 54mnutes of non-face-to-face time during this encounter talking with patient and reviewing CPAP download    [facility] [address] [place] [address] Phone [phone] [phone]

## 2019-09-26 ENCOUNTER — Telehealth: Payer: Self-pay

## 2019-09-26 NOTE — Telephone Encounter (Signed)
PA for Ajovy 240m/1.5ml Your demographic data has been sent to the plan successfully. They will respond with your clinical questions and you will be notified by email when available within the next business day. You can also check for an update later by opening this request from your dashboard. Please do not fax or call the plan to resubmit this request. If you need assistance, please chat with CoverMyMeds or call [place] [phone]

## 2019-09-27 NOTE — Telephone Encounter (Signed)
Aimovig 234m/1.5ml [place] approve from 09/27/2019 to 09/20/2020. Any questions call customer care at [phone]

## 2019-09-27 NOTE — Telephone Encounter (Signed)
Your information has been submitted to [place]. To check for an updated outcome later, reopen this PA request from your dashboard. If [employer] has not responded to your request within 24 hours, contact [place] at [phone]. If you think there may be a problem with your PA request, use our live chat feature at the bottom right.

## 2019-10-10 ENCOUNTER — Telehealth: Payer: Self-pay | Admitting: Adult Health

## 2019-10-10 MED ORDER — PREDNISONE 5 MG PO TABS: ORAL_TABLET | ORAL | 0 refills | Status: DC

## 2019-10-10 NOTE — Telephone Encounter (Signed)
Pt called in and left a voicemail stating she has had a migraine since 10/07/2019 and wants to know if something can be prescribed to help her  CB# [phone]

## 2019-10-10 NOTE — Telephone Encounter (Signed)
I called pt and relayed that prescription sent to her to [facility].  She appreciated this and will call us if does not work for her.  She will she said.

## 2019-10-10 NOTE — Telephone Encounter (Signed)
I called pt.  She stated that she has had migraine since 10-07-19, has tried her usual meds (motrin, ice packs, lying down).  This has not helped.  Not taking goodypowders.  Is using her cpap.  No sure what triggered.  Level over 10 today.  Other sx nausea, lightheadedness.  R temple pain.  She has not come in for infusion before.  She states we have called in prednisone before. She is not diabetic.  She takes ajovy.  [place] (not able to get approved.

## 2019-10-10 NOTE — Telephone Encounter (Signed)
Prednisone Dose pack sent in for patient

## 2019-12-31 ENCOUNTER — Other Ambulatory Visit: Payer: Self-pay | Admitting: Adult Health

## 2020-03-13 ENCOUNTER — Encounter: Payer: Self-pay | Admitting: Adult Health

## 2020-03-15 ENCOUNTER — Ambulatory Visit: Payer: BC Managed Care – PPO | Admitting: Adult Health

## 2020-03-15 ENCOUNTER — Other Ambulatory Visit: Payer: Self-pay

## 2020-03-15 VITALS — Temp 97.9°F | Ht 63.0 in | Wt 264.0 lb

## 2020-03-15 DIAGNOSIS — G43709 Chronic migraine without aura, not intractable, without status migrainosus: Secondary | ICD-10-CM

## 2020-03-15 DIAGNOSIS — Z9989 Dependence on other enabling machines and devices: Secondary | ICD-10-CM

## 2020-03-15 DIAGNOSIS — G4733 Obstructive sleep apnea (adult) (pediatric): Secondary | ICD-10-CM

## 2020-03-15 NOTE — Patient Instructions (Signed)
Continue using CPAP nightly and greater than 4 hours each night Continue Ajovy If your symptoms worsen or you develop new symptoms please let us know.

## 2020-03-15 NOTE — Progress Notes (Addendum)
PATIENT: Wanda [name] DOB: [birth year]  REASON FOR VISIT: follow up HISTORY FROM: patient  HISTORY OF PRESENT ILLNESS: Today 03/15/20:  Wanda [name] is a 49 year old female with a history of obstructive sleep apnea on CPAP and migraine headaches.  She returns today for follow-up.  Her download indicates that she use her machine 19 out of 30 days for compliance of 63%.  She used her machine greater than 4 hours 18 days for compliance of 60%.  On average she uses her machine 6 hours and 36 minutes.  Her residual AHI is 1.1 on 6 cm of water with EPR 3.  She does not have a significant leak.  She continues on Ajovy for migraine headaches she reports that her headaches have improved.  She has approximately 3 headaches a month.  Her headaches typically last 24 hours.  HISTORY Wanda [name] is a 49 y.o. female who has been followed in this office for migraine headaches and obstructive sleep apnea on CPAP.  She was initially scheduled for a video visit however her camera was not working so she was transitioned to a telephone visit.  She reports that since she started Ajovy her headaches have decreased to 3-4 times a month.  They still occur above the left eye and in the temporal region.  She reports photophobia and phonophobia.  She reports that she has only had nausea with one headache since she started this medication.  She is not taking any over-the-counter medication.  She states with her headaches she tries to lay down and use an ice pack.  Her CPAP download indicates that she restarted using the CPAP in July for approximately 1 week but then used it only periodically and August, even less in September and not at all in October.  She states that she was sick in August with a cold and was not able to use it.  She returns today for evaluation.   REVIEW OF SYSTEMS: Out of a complete 14 system review of symptoms, the patient complains only of the following symptoms, and all other reviewed systems  are negative.  FSS 36 ESS 18  ALLERGIES: Allergies  Allergen Reactions  . Imitrex [Sumatriptan] Other (See Comments)    "felt bad, had chest pressure"    HOME MEDICATIONS: Outpatient Medications Prior to Visit  Medication Sig Dispense Refill  . AJOVY 225 MG/1.5ML SOAJ INJECT 225 MG INTO THE SKIN EVERY 30 DAYS 1 pen 8  . amLODipine (NORVASC) 5 MG tablet TAKE ONE TABLET BY MOUTH EVERY DAY 30 tablet 0  . hydrochlorothiazide (HYDRODIURIL) 12.5 MG tablet Take 12.5 mg by mouth daily.    . Ibuprofen (ADVIL MIGRAINE PO) Take by mouth as needed.    . predniSONE (DELTASONE) 5 MG tablet Begin taking 6 tablets daily, taper by one tablet daily until off the medication. 21 tablet 0  . valACYclovir (VALTREX) 500 MG tablet      No facility-administered medications prior to visit.    PAST MEDICAL HISTORY: Past Medical History:  Diagnosis Date  . Hypertension   . Migraine   . Obesity     PAST SURGICAL HISTORY: Past Surgical History:  Procedure Laterality Date  . CESAREAN SECTION    . CHOLECYSTECTOMY    . ENDOMETRIAL ABLATION      FAMILY HISTORY: Family History  Problem Relation Age of Onset  . Kidney disease Brother        dialysis passed away 63 yrs  . Breast cancer Mother   .  Cancer Maternal Uncle        Breast cancer  . Cancer Maternal Grandmother        Breast cancer  . Breast cancer Unknown        1st degree relative< 50  . Hypertension Unknown     SOCIAL HISTORY: Social History   Socioeconomic History  . Marital status: Married    Spouse name: Not on file  . Number of children: 3  . Years of education: BA  . Highest education level: Not on file  Occupational History  . Occupation: [employer]  . Occupation: ACCOUNTANT    Employer: [employer] Education administrator: [employer]  Tobacco Use  . Smoking status: Never Smoker  . Smokeless tobacco: Never Used  Substance and Sexual Activity  . Alcohol use: Yes    Comment: wine occassionally  . Drug use: No  .  Sexual activity: Not on file  Other Topics Concern  . Not on file  Social History Narrative   Lives at home w/ her dog   Right-handed   Caffeine: rare   Social Determinants of Health   Financial Resource Strain:   . Difficulty of Paying Living Expenses:   Food Insecurity:   . Worried About Charity fundraiser in the Last Year:   . Arboriculturist in the Last Year:   Transportation Needs:   . Film/video editor (Medical):   Marland Kitchen Lack of Transportation (Non-Medical):   Physical Activity:   . Days of Exercise per Week:   . Minutes of Exercise per Session:   Stress:   . Feeling of Stress :   Social Connections:   . Frequency of Communication with Friends and Family:   . Frequency of Social Gatherings with Friends and Family:   . Attends Religious Services:   . Active Member of Clubs or Organizations:   . Attends Archivist Meetings:   Marland Kitchen Marital Status:   Intimate Partner Violence:   . Fear of Current or Ex-Partner:   . Emotionally Abused:   Marland Kitchen Physically Abused:   . Sexually Abused:       PHYSICAL EXAM  Vitals:   03/15/20 1432  Temp: 97.9 F (36.6 C)  Weight: 264 lb (119.7 kg)  Height: 5' 3"  (1.6 m)   Body mass index is 46.77 kg/m.  Generalized: Well developed, in no acute distress  Chest: Lungs clear to auscultation bilaterally  Neurological examination  Mentation: Alert oriented to time, place, history taking. Follows all commands speech and language fluent Cranial nerve II-XII: Extraocular movements were full, visual field were full on confrontational test Head turning and shoulder shrug  were normal and symmetric. Motor: The motor testing reveals 5 over 5 strength of all 4 extremities. Good symmetric motor tone is noted throughout.  Sensory: Sensory testing is intact to soft touch on all 4 extremities. No evidence of extinction is noted.  Gait and station: Gait is normal.    DIAGNOSTIC DATA (LABS, IMAGING, TESTING) - I reviewed patient records,  labs, notes, testing and imaging myself where available.  Lab Results  Component Value Date   WBC 8.1 09/24/2018   HGB 14.4 09/24/2018   HCT 46.8 (H) 09/24/2018   MCV 85.2 09/24/2018   PLT 358 09/24/2018      Component Value Date/Time   NA 139 09/24/2018 1932   K 3.5 09/24/2018 1932   CL 104 09/24/2018 1932   CO2 27 09/24/2018 1932  GLUCOSE 136 (H) 09/24/2018 1932   BUN <5 (L) 09/24/2018 1932   CREATININE 0.86 09/24/2018 1932   CALCIUM 9.5 09/24/2018 1932   PROT 7.5 12/22/2012 1854   ALBUMIN 3.6 12/22/2012 1854   AST 19 12/22/2012 1854   ALT 12 12/22/2012 1854   ALKPHOS 65 12/22/2012 1854   BILITOT 0.4 12/22/2012 1854   GFRNONAA >60 09/24/2018 1932   GFRAA >60 09/24/2018 1932   Lab Results  Component Value Date   CHOL 142 02/06/2010   HDL 48.10 02/06/2010   LDLCALC 82 02/06/2010   TRIG 61.0 02/06/2010   CHOLHDL 3 02/06/2010   Lab Results  Component Value Date   HGBA1C 6.3 11/18/2010   No results found for: VITAMINB12 Lab Results  Component Value Date   TSH 1.00 08/22/2009      ASSESSMENT AND PLAN 49 y.o. year old female  has a past medical history of Hypertension, Migraine, and Obesity. here with:  1. OSA on CPAP  - CPAP compliance excellent - Good treatment of AHI  - Encourage patient to use CPAP nightly and > 4 hours each night  2.  Migraine headaches -Continue Ajovy  -Advised if symptoms worsen or she develops new symptoms she should let us know - F/U in 1 year or sooner if needed   I spent 20 minutes of face-to-face and non-face-to-face time with patient.  This included previsit chart review, lab review, study review, order entry, electronic health record documentation, patient education.  Ward Givens, MSN, NP-C 03/15/2020, 2:24 PM [facility] [address], [place],  [address] [phone]  I reviewed the above note and documentation by the Nurse Practitioner and agree with the history, exam, assessment  and plan as outlined above. I was available for consultation. Star Age, MD, PhD [facility])

## 2020-04-29 ENCOUNTER — Encounter (HOSPITAL_COMMUNITY): Payer: Self-pay

## 2020-04-29 ENCOUNTER — Ambulatory Visit (HOSPITAL_COMMUNITY)
Admission: EM | Admit: 2020-04-29 | Discharge: 2020-04-29 | Disposition: A | Discharge status: Home / Self Care | Payer: BC Managed Care – PPO | Attending: Family Medicine | Admitting: Family Medicine

## 2020-04-29 ENCOUNTER — Other Ambulatory Visit: Payer: Self-pay

## 2020-04-29 DIAGNOSIS — R05 Cough: Secondary | ICD-10-CM | POA: Diagnosis not present

## 2020-04-29 DIAGNOSIS — Z79899 Other long term (current) drug therapy: Secondary | ICD-10-CM | POA: Diagnosis not present

## 2020-04-29 DIAGNOSIS — R0981 Nasal congestion: Secondary | ICD-10-CM | POA: Diagnosis not present

## 2020-04-29 DIAGNOSIS — R509 Fever, unspecified: Secondary | ICD-10-CM

## 2020-04-29 DIAGNOSIS — E669 Obesity, unspecified: Secondary | ICD-10-CM | POA: Diagnosis not present

## 2020-04-29 DIAGNOSIS — I10 Essential (primary) hypertension: Secondary | ICD-10-CM | POA: Insufficient documentation

## 2020-04-29 DIAGNOSIS — R059 Cough, unspecified: Secondary | ICD-10-CM

## 2020-04-29 DIAGNOSIS — B349 Viral infection, unspecified: Secondary | ICD-10-CM | POA: Diagnosis not present

## 2020-04-29 DIAGNOSIS — R52 Pain, unspecified: Secondary | ICD-10-CM | POA: Diagnosis not present

## 2020-04-29 DIAGNOSIS — R5383 Other fatigue: Secondary | ICD-10-CM | POA: Diagnosis not present

## 2020-04-29 DIAGNOSIS — U071 COVID-19: Secondary | ICD-10-CM | POA: Diagnosis not present

## 2020-04-29 DIAGNOSIS — R519 Headache, unspecified: Secondary | ICD-10-CM

## 2020-04-29 DIAGNOSIS — R6883 Chills (without fever): Secondary | ICD-10-CM

## 2020-04-29 DIAGNOSIS — Z7901 Long term (current) use of anticoagulants: Secondary | ICD-10-CM | POA: Diagnosis not present

## 2020-04-29 LAB — POC INFLUENZA A AND B ANTIGEN (URGENT CARE ONLY)
Influenza A Ag: NEGATIVE
Influenza B Ag: NEGATIVE

## 2020-04-29 MED ORDER — CETIRIZINE-PSEUDOEPHEDRINE ER 5-120 MG PO TB12: 1.0000 | ORAL_TABLET | Freq: Every day | ORAL | 0 refills | Status: DC

## 2020-04-29 MED ORDER — BENZONATATE 100 MG PO CAPS: 100.0000 mg | ORAL_CAPSULE | Freq: Three times a day (TID) | ORAL | 0 refills | Status: DC

## 2020-04-29 NOTE — ED Triage Notes (Signed)
Patient reports generalized body aches, headaches and cough x3 days.

## 2020-04-29 NOTE — ED Provider Notes (Signed)
[facility]   [id number] 04/29/20 Arrival Time: 6063   CC: COVID symptoms  SUBJECTIVE: History from: patient.  Wanda [name] is a 49 y.o. female who presents with abrupt onset of nasal congestion, PND, cough, fatigue, fever, chills, body aches, headache, and sore throat for the last 3 days. Denies sick exposure to COVID, flu or strep. Has not had Covid or flu vaccines in the last year. Denies recent travel. Has tried tylenol with little relief. There are no aggravating symptoms. Denies previous symptoms in the past. Denies sinus pain, rhinorrhea, SOB, wheezing, chest pain, nausea, changes in bowel or bladder habits.    ROS: As per HPI.  All other pertinent ROS negative.     Past Medical History:  Diagnosis Date  . Hypertension   . Migraine   . Obesity    Past Surgical History:  Procedure Laterality Date  . CESAREAN SECTION    . CHOLECYSTECTOMY    . ENDOMETRIAL ABLATION     Allergies  Allergen Reactions  . Imitrex [Sumatriptan] Other (See Comments)    "felt bad, had chest pressure"   No current facility-administered medications on file prior to encounter.   Current Outpatient Medications on File Prior to Encounter  Medication Sig Dispense Refill  . AJOVY 225 MG/1.5ML SOAJ INJECT 225 MG INTO THE SKIN EVERY 30 DAYS 1 pen 8  . amLODipine (NORVASC) 5 MG tablet TAKE ONE TABLET BY MOUTH EVERY DAY 30 tablet 0  . Ibuprofen (ADVIL MIGRAINE PO) Take by mouth as needed.    . linaclotide (LINZESS) 72 MCG capsule Linzess 72 mcg capsule  TAKE 1 CAPSULE BY MOUTH EVERY DAY    . valACYclovir (VALTREX) 500 MG tablet      Social History   Socioeconomic History  . Marital status: Married    Spouse name: Not on file  . Number of children: 3  . Years of education: BA  . Highest education level: Not on file  Occupational History  . Occupation: [employer]  . Occupation: ACCOUNTANT    Employer: [employer] Education administrator: [employer]  Tobacco Use  . Smoking status: Never  Smoker  . Smokeless tobacco: Never Used  Substance and Sexual Activity  . Alcohol use: Yes    Comment: wine occassionally  . Drug use: No  . Sexual activity: Not on file  Other Topics Concern  . Not on file  Social History Narrative   Lives at home w/ her dog   Right-handed   Caffeine: rare   Social Determinants of Health   Financial Resource Strain:   . Difficulty of Paying Living Expenses:   Food Insecurity:   . Worried About Charity fundraiser in the Last Year:   . Arboriculturist in the Last Year:   Transportation Needs:   . Film/video editor (Medical):   Marland Kitchen Lack of Transportation (Non-Medical):   Physical Activity:   . Days of Exercise per Week:   . Minutes of Exercise per Session:   Stress:   . Feeling of Stress :   Social Connections:   . Frequency of Communication with Friends and Family:   . Frequency of Social Gatherings with Friends and Family:   . Attends Religious Services:   . Active Member of Clubs or Organizations:   . Attends Archivist Meetings:   Marland Kitchen Marital Status:   Intimate Partner Violence:   . Fear of Current or Ex-Partner:   .  Emotionally Abused:   Marland Kitchen Physically Abused:   . Sexually Abused:    Family History  Problem Relation Age of Onset  . Kidney disease Brother        dialysis passed away 24 yrs  . Breast cancer Mother   . Cancer Maternal Uncle        Breast cancer  . Cancer Maternal Grandmother        Breast cancer  . Breast cancer Unknown        1st degree relative< 50  . Hypertension Unknown     OBJECTIVE:  Vitals:   04/29/20 1158  BP: (!) 134/94  Pulse: 89  Resp: 20  Temp: (!) 100.6 F (38.1 C)  TempSrc: Oral  SpO2: 97%     General appearance: alert; appears fatigued, but nontoxic; speaking in full sentences and tolerating own secretions HEENT: NCAT; Ears: EACs clear, TMs pearly gray; Eyes: PERRL.  EOM grossly intact. Sinuses: nontender; Nose: nares patent without rhinorrhea, Throat: oropharynx clear,  tonsils non erythematous or enlarged, uvula midline  Neck: supple without LAD Lungs: unlabored respirations, symmetrical air entry; cough: mild; no respiratory distress; CTAB Heart: regular rate and rhythm.  Radial pulses 2+ symmetrical bilaterally Skin: warm and dry Psychological: alert and cooperative; normal mood and affect   ASSESSMENT & PLAN:  1. Body aches   2. Cough   3. Nonintractable headache, unspecified chronicity pattern, unspecified headache type   4. Fever, unspecified fever cause   5. Chills   6. Viral illness     Flu swab in office is negative Prescribed tessalon perles Prescribed zyrtec D   COVID testing ordered.  It will take between 1-2 days for test results.  Someone will contact you regarding abnormal results.    Patient should remain in quarantine until they have received Covid results.  If negative you may resume normal activities (go back to work/school) while practicing hand hygiene, social distance, and mask wearing.  If positive, patient should remain in quarantine for 10 days from symptom onset AND greater than 72 hours after symptoms resolution (absence of fever without the use of fever-reducing medication and improvement in respiratory symptoms), whichever is longer Get plenty of rest and push fluids Use OTC flonase for nasal congestion and runny nose Use medications daily for symptom relief Use OTC medications like ibuprofen or tylenol as needed fever or pain Call or go to the ED if you have any new or worsening symptoms such as fever, worsening cough, shortness of breath, chest tightness, chest pain, turning blue, changes in mental status.  Reviewed expectations re: course of current medical issues. Questions answered. Outlined signs and symptoms indicating need for more acute intervention. Patient verbalized understanding. After Visit Summary given.         Faustino Congress, NP 04/29/20 1303

## 2020-04-29 NOTE — Discharge Instructions (Addendum)
Your COVID test is pending.  You should self quarantine until the test result is back.    Take Tylenol as needed for fever or discomfort.  Rest and keep yourself hydrated.    Go to the emergency department if you develop shortness of breath, severe diarrhea, high fever not relieved by Tylenol or ibuprofen, or other concerning symptoms.    Flu swab was negative today

## 2020-04-30 LAB — SARS CORONAVIRUS 2 (TAT 6-24 HRS): SARS Coronavirus 2: POSITIVE — AB

## 2020-05-01 ENCOUNTER — Telehealth: Payer: Self-pay | Admitting: Nurse Practitioner

## 2020-05-01 NOTE — Telephone Encounter (Signed)
Called to discuss with [place] Pink about Covid symptoms and the use of  bamlanivimab/etesevimab or casirivimab/imdevimab, a combination monoclonal antibody infusion for those with mild to moderate Covid symptoms and at a high risk of hospitalization.     Pt is qualified for this infusion at the [facility] infusion center due to co-morbid conditions and/or a member of an at-risk group, however declines infusion at this time (obesity, BMI >25, hypertension).  Preventative practices reviewed. Patient verbalized understanding. Patient advised to call back if he decides that he does want to get infusion. Callback number to the infusion center given. Patient advised to go to Urgent care or ED with severe symptoms. Reports symptoms of congestion, aches, cough with onset 04/26/20. Last date she would be eligible for infusion is 05/05/20.  Contact information given.    Patient Active Problem List   Diagnosis Date Noted  . HYPERGLYCEMIA 11/18/2010  . URI 08/19/2010  . MIGRAINE HEADACHE 07/09/2010  . HYPOKALEMIA 03/12/2010  . ALLERGIC RHINITIS 03/12/2010  . MORBID OBESITY 08/22/2009  . Migraine without aura 08/22/2009  . HYPERTENSION 08/22/2009    Alda Lea, AGPCNP-BC Pager: [phone] Amion: Bjorn Pippin

## 2020-09-27 ENCOUNTER — Telehealth: Payer: Self-pay | Admitting: Adult Health

## 2020-09-27 NOTE — Telephone Encounter (Signed)
Received PA authorization renewal for Ajovy. Patient was last seen on 03/15/20 by [facility]. She is not scheduled for an appt until April 2022. I sent a MyChart message to patient to see if the Arie Sabina is still working before starting the process.

## 2020-09-27 NOTE — Telephone Encounter (Signed)
Received response from patient that the Arie Sabina is still working. PA was started on [email or web address]. Key is [id number]. Per [email or web address], a determination will be made within 24 hours.   Once PA has been approved, will call the pharmacy to provide them with the discount savings card information for patient.

## 2020-10-01 NOTE — Telephone Encounter (Signed)
Received fax from insurance stating that the PA has been approved. A copy of the determination has been faxed to pharmacy.

## 2020-10-04 ENCOUNTER — Other Ambulatory Visit: Payer: Self-pay | Admitting: Adult Health

## 2021-03-20 ENCOUNTER — Ambulatory Visit: Payer: Self-pay | Admitting: Adult Health

## 2021-05-08 ENCOUNTER — Ambulatory Visit: Payer: Self-pay | Admitting: Adult Health

## 2021-07-17 ENCOUNTER — Other Ambulatory Visit: Payer: Self-pay | Admitting: Adult Health

## 2021-08-14 ENCOUNTER — Other Ambulatory Visit: Payer: Self-pay | Admitting: Adult Health

## 2021-12-17 ENCOUNTER — Telehealth: Payer: Self-pay | Admitting: Adult Health

## 2021-12-17 NOTE — Telephone Encounter (Signed)
I see here that pt has now been scheduled for tomorrow 12/18/21.

## 2021-12-17 NOTE — Telephone Encounter (Signed)
Pt having migraines for 2 weeks ago. Taking Ibuprofen and is not helping. Would like a call from the nurse to discuss a sooner appt. Have not been to the ER, but may have to go due to appt scheduled is in May.

## 2021-12-17 NOTE — Telephone Encounter (Signed)
I called patient. There is an appointment available tomorrow at 9:30am with Jinny Blossom, NP. I offered her this appointment and she accepted. Pt verbalized understanding of new appointment date and time.

## 2021-12-18 ENCOUNTER — Ambulatory Visit: Payer: BC Managed Care – PPO | Admitting: Adult Health

## 2021-12-18 ENCOUNTER — Encounter: Payer: Self-pay | Admitting: Adult Health

## 2021-12-18 VITALS — BP 139/98 | HR 78 | Ht 63.0 in | Wt 250.4 lb

## 2021-12-18 DIAGNOSIS — G4733 Obstructive sleep apnea (adult) (pediatric): Secondary | ICD-10-CM

## 2021-12-18 DIAGNOSIS — G43709 Chronic migraine without aura, not intractable, without status migrainosus: Secondary | ICD-10-CM | POA: Diagnosis not present

## 2021-12-18 DIAGNOSIS — Z9989 Dependence on other enabling machines and devices: Secondary | ICD-10-CM | POA: Diagnosis not present

## 2021-12-18 MED ORDER — AJOVY 225 MG/1.5ML ~~LOC~~ SOAJ: 225.0000 mg | SUBCUTANEOUS | 11 refills | Status: DC

## 2021-12-18 NOTE — Progress Notes (Signed)
PATIENT: Wanda [name] DOB: 05-05-1971  REASON FOR VISIT: follow up Wanda FROM: patient  Wanda OF PRESENT ILLNESS: Today 12/18/21:  Wanda [name] is a 51 year old female with a Wanda of obstructive sleep apnea on CPAP and migraine headaches.  The patient has not been seen in this office since April 2021.  She states that she has not taken Ajovy in 8 months.  She has not used the CPAP in 2 years.  She states that the reason she is not using the CPAP is she requested new supplies and she was told her insurance would not cover the cost so she cannot pay for it.  The patient states that she stopped Ajovy because she got 3 pens that did not work.  Reports that she called the pharmacy but they reported they cannot do anything about it.  She states that she typically wakes up with a dull headache.  Typically around 7-7 30 at bedtime she will get a migraine.  Typically occurring on the left side radiating to the occipital region.  States that she uses an ice pack in a cool dark room and that is helpful.  03/15/20: Wanda [name] is a 51 year old female with a Wanda of obstructive sleep apnea on CPAP and migraine headaches.  She returns today for follow-up.  Her download indicates that she use her machine 19 out of 30 days for compliance of 63%.  She used her machine greater than 4 hours 18 days for compliance of 60%.  On average she uses her machine 6 hours and 36 minutes.  Her residual AHI is 1.1 on 6 cm of water with EPR 3.  She does not have a significant leak.  She continues on Ajovy for migraine headaches she reports that her headaches have improved.  She has approximately 3 headaches a month.  Her headaches typically last 24 hours.  Wanda [name] is a 51 y.o. female who has been followed in this office for migraine headaches and obstructive sleep apnea on CPAP.  She was initially scheduled for a video visit however her camera was not working so she was transitioned to a telephone visit.    She reports that since she started Ajovy her headaches have decreased to 3-4 times a month.  They still occur above the left eye and in the temporal region.  She reports photophobia and phonophobia.  She reports that she has only had nausea with one headache since she started this medication.  She is not taking any over-the-counter medication.  She states with her headaches she tries to lay down and use an ice pack.  Her CPAP download indicates that she restarted using the CPAP in July for approximately 1 week but then used it only periodically and August, even less in September and not at all in October.  She states that she was sick in August with a cold and was not able to use it.  She returns today for evaluation.    REVIEW OF SYSTEMS: Out of a complete 14 system review of symptoms, the patient complains only of the following symptoms, and all other reviewed systems are negative.  ALLERGIES: Allergies  Allergen Reactions   Imitrex [Sumatriptan] Other (See Comments)    "felt bad, had chest pressure"    HOME MEDICATIONS: Outpatient Medications Prior to Visit  Medication Sig Dispense Refill   amLODipine (NORVASC) 5 MG tablet TAKE ONE TABLET BY MOUTH EVERY DAY 30 tablet 0   Fremanezumab-vfrm (AJOVY) 225 MG/1.5ML SOAJ Inject  225 mg as directed every 30 (thirty) days. Appointment required for further refills. Call [phone] 1.5 mL 0   Ibuprofen (ADVIL MIGRAINE PO) Take by mouth as needed.     linaclotide (LINZESS) 72 MCG capsule Linzess 72 mcg capsule  TAKE 1 CAPSULE BY MOUTH EVERY DAY     SAXENDA 18 MG/3ML SOPN Inject 3 mg into the skin daily.     valACYclovir (VALTREX) 500 MG tablet      benzonatate (TESSALON) 100 MG capsule Take 1 capsule (100 mg total) by mouth every 8 (eight) hours. 21 capsule 0   cetirizine-pseudoephedrine (ZYRTEC-D) 5-120 MG tablet Take 1 tablet by mouth daily. 30 tablet 0   No facility-administered medications prior to visit.    PAST MEDICAL Wanda: Past  Medical Wanda:  Diagnosis Date   Hypertension    Migraine    Obesity     PAST SURGICAL Wanda: Past Surgical Wanda:  Procedure Laterality Date   CESAREAN SECTION     CHOLECYSTECTOMY     ENDOMETRIAL ABLATION      FAMILY Wanda: Family Wanda  Problem Relation Age of Onset   Breast cancer Mother    Kidney disease Brother        dialysis passed away 78 yrs   Cancer Maternal Uncle        Breast cancer   Cancer Maternal Grandmother        Breast cancer   Breast cancer Other        1st degree relative< 79   Hypertension Other    Migraines Daughter    Migraines Daughter    Migraines Son     SOCIAL Wanda: Social Wanda   Socioeconomic Wanda   Marital status: Divorced    Spouse name: Not on file   Number of children: 3   Years of education: BA   Highest education level: Not on file  Occupational Wanda   Occupation: [employer]   Occupation: Aeronautical engineer: [employer] Education administrator: [employer]  Tobacco Use   Smoking status: Never   Smokeless tobacco: Never  Vaping Use   Vaping Use: Never used  Substance and Sexual Activity   Alcohol use: Yes    Comment: wine occassionally   Drug use: No   Sexual activity: Not on file  Other Topics Concern   Not on file  Social Wanda Narrative   Lives at home w/ her dog   Right-handed   Caffeine: rare   Social Determinants of Health   Financial Resource Strain: Not on file  Food Insecurity: Not on file  Transportation Needs: Not on file  Physical Activity: Not on file  Stress: Not on file  Social Connections: Not on file  Intimate Partner Violence: Not on file      PHYSICAL EXAM  Vitals:   12/18/21 0944  BP: (!) 139/98  Pulse: 78  Weight: 250 lb 6.4 oz (113.6 kg)  Height: 5' 3"  (1.6 m)   Body mass index is 44.36 kg/m.  Generalized: Well developed, in no acute distress  Chest: Lungs clear to auscultation bilaterally  Neurological examination  Mentation: Alert oriented to  time, place, Wanda taking. Follows all commands speech and language fluent Cranial nerve II-XII: Extraocular movements were full, visual field were full on confrontational test Head turning and shoulder shrug  were normal and symmetric. Motor: The motor testing reveals 5 over 5 strength of all 4 extremities. Good symmetric motor tone is noted throughout.  Sensory: Sensory testing is intact to soft touch on all 4 extremities. No evidence of extinction is noted.  Gait and station: Gait is normal.    DIAGNOSTIC DATA (LABS, IMAGING, TESTING) - I reviewed patient records, labs, notes, testing and imaging myself where available.  Lab Results  Component Value Date   WBC 8.1 09/24/2018   HGB 14.4 09/24/2018   HCT 46.8 (H) 09/24/2018   MCV 85.2 09/24/2018   PLT 358 09/24/2018      Component Value Date/Time   NA 139 09/24/2018 1932   K 3.5 09/24/2018 1932   CL 104 09/24/2018 1932   CO2 27 09/24/2018 1932   GLUCOSE 136 (H) 09/24/2018 1932   BUN <5 (L) 09/24/2018 1932   CREATININE 0.86 09/24/2018 1932   CALCIUM 9.5 09/24/2018 1932   PROT 7.5 12/22/2012 1854   ALBUMIN 3.6 12/22/2012 1854   AST 19 12/22/2012 1854   ALT 12 12/22/2012 1854   ALKPHOS 65 12/22/2012 1854   BILITOT 0.4 12/22/2012 1854   GFRNONAA >60 09/24/2018 1932   GFRAA >60 09/24/2018 1932   Lab Results  Component Value Date   CHOL 142 02/06/2010   HDL 48.10 02/06/2010   LDLCALC 82 02/06/2010   TRIG 61.0 02/06/2010   CHOLHDL 3 02/06/2010   Lab Results  Component Value Date   HGBA1C 6.3 11/18/2010   No results found for: VITAMINB12 Lab Results  Component Value Date   TSH 1.00 08/22/2009      ASSESSMENT AND PLAN 51 y.o. year old female  has a past medical Wanda of Hypertension, Migraine, and Obesity. here with:  OSA on CPAP  -Noncompliant -Advised that she should restart CPAP therapy. -We will send an order to her DME company for new supplies - Encourage patient to use CPAP nightly and > 4 hours each  night  2.  Migraine headaches -Restart Ajovy  -Advised if symptoms worsen or she develops new symptoms she should let us know - F/U in 6 months or sooner if needed  .  Ward Givens, MSN, NP-C 12/18/2021, 9:49 AM [facility] [address], [place],  [address] [phone]  I reviewed the above note and documentation by the Nurse Practitioner and agree with the Wanda, exam, assessment and plan as outlined above. I was available for consultation. Star Age, MD, PhD [facility])

## 2021-12-18 NOTE — Patient Instructions (Signed)
Your Plan: Restart Ajovy Restart CPAP  Thank you for coming to see [place] at [facility]. I hope we have been able to provide you high quality care today.  You may receive a patient satisfaction survey over the next few weeks. We would appreciate your feedback and comments so that we may continue to improve ourselves and the health of our patients.

## 2021-12-19 ENCOUNTER — Telehealth: Payer: Self-pay | Admitting: *Deleted

## 2021-12-19 NOTE — Telephone Encounter (Signed)
Megan NP placed an order for pt to receive cpap supplies. Order sent to [employer].

## 2021-12-19 NOTE — Telephone Encounter (Signed)
Completed Ajovy PA on Cover My Meds. Key: [id number]. Awaiting determination from [place] within 24 hours.

## 2021-12-20 NOTE — Telephone Encounter (Signed)
Ajovy approved 12/19/2021 - 03/19/2022. Notified patient.

## 2022-01-06 NOTE — Telephone Encounter (Signed)
Secure message sent to [employer].

## 2022-01-07 NOTE — Telephone Encounter (Signed)
Raeford Razor, RN; [place], Margreta Journey So it looks like she has a [phone] deductible that would need to be met before the 80/20 rule kicks in. I tried to call her to let her know of this. No answer LVM.

## 2022-03-31 ENCOUNTER — Telehealth: Payer: Self-pay

## 2022-03-31 NOTE — Telephone Encounter (Signed)
Received PA request for Ajovy. Completed via CMM. Sent to [facility].  Key: [id number]. Should have a determination within 1-3 business days. ?

## 2022-04-01 NOTE — Telephone Encounter (Signed)
PA for Ajovy approved from 03/31/22-04/01/23 by [facility].  Ref #  [id number]. ?

## 2022-04-21 ENCOUNTER — Ambulatory Visit: Payer: BC Managed Care – PPO | Admitting: Adult Health

## 2022-05-19 LAB — HM PAP SMEAR: HPV, high-risk: NEGATIVE

## 2022-06-05 ENCOUNTER — Encounter: Payer: Self-pay | Admitting: Gastroenterology

## 2022-06-05 ENCOUNTER — Ambulatory Visit: Payer: BC Managed Care – PPO | Admitting: Gastroenterology

## 2022-06-05 VITALS — BP 110/84 | HR 87 | Ht 63.0 in | Wt 256.0 lb

## 2022-06-05 DIAGNOSIS — Z1212 Encounter for screening for malignant neoplasm of rectum: Secondary | ICD-10-CM

## 2022-06-05 DIAGNOSIS — Z1211 Encounter for screening for malignant neoplasm of colon: Secondary | ICD-10-CM

## 2022-06-05 DIAGNOSIS — K5900 Constipation, unspecified: Secondary | ICD-10-CM | POA: Diagnosis not present

## 2022-06-05 MED ORDER — NA SULFATE-K SULFATE-MG SULF 17.5-3.13-1.6 GM/177ML PO SOLN: 1.0000 | Freq: Once | ORAL | 0 refills | Status: AC

## 2022-06-05 MED ORDER — MOTEGRITY 2 MG PO TABS: 2.0000 mg | ORAL_TABLET | Freq: Every day | ORAL | 3 refills | Status: DC

## 2022-06-05 NOTE — Progress Notes (Signed)
HPI : Wanda [name] is a very pleasant 51 year old female with a history of morbid obesity, hypertension and migraines who is referred to [place] by Everardo Beals, NP for further management of constipation and for colon cancer screening.  The patient reports she has had long standing problems with constipation going back many years.  She reports that her last bowel movement was 2 weeks ago, but that she has gone 'months' without a bowel movement.  She describes infrequent urges to defecate, but also has significant straining and difficulty evacuating stool.  She reports frequent episodes of unsuccessfully trying to pass stool multiple days in a row. Diarrhea is never a problem for her.  Abdominal pain is not typically a significant problem either, although she does feel nauseated, bloated, full and uncomfortable.  Her appetite is good.  She denies any unintentional weight loss (her fluctuates, but has been overall stable). No blood in the stool.  No family history of colon cancer.  She has never had a colonoscopy.  She has tried many different medications for her constipation.  Currently, she takes Linzess 290 mcg.  Previously she had been taking the 72 mcg for over a year and it worked very well for her, but it just stopped working.  Her dose was increased to 290 mcg and that worked well for about 4-5  months before it too, lost effectiveness.  Before Linzess, she had taken Amitiza for several years before it stopped working for her. She has also taken many OTC medications which either were ineffective or caused side effects.  Magnesium citrate caused significant nausea.  Benefiber, metamucil and flax seed weren't effective.  She continues to take Miralax daily but isn't sure it is helping.  Past Medical History:  Diagnosis Date   Hypertension    Migraine    Obesity    Sleep apnea    has a c-pap     Past Surgical History:  Procedure Laterality Date   CESAREAN SECTION     CHOLECYSTECTOMY      ENDOMETRIAL ABLATION     Family History  Problem Relation Age of Onset   Breast cancer Mother    Kidney disease Brother        dialysis passed away 90 yrs   Cancer Maternal Uncle        Breast cancer   Cancer Maternal Grandmother        Breast cancer   Breast cancer Other        1st degree relative< 63   Hypertension Other    Migraines Daughter    Migraines Daughter    Migraines Son    Social History   Tobacco Use   Smoking status: Never   Smokeless tobacco: Never  Vaping Use   Vaping Use: Never used  Substance Use Topics   Alcohol use: Not Currently    Comment: wine occassionally   Drug use: No   Current Outpatient Medications  Medication Sig Dispense Refill   amLODipine (NORVASC) 5 MG tablet TAKE ONE TABLET BY MOUTH EVERY DAY 30 tablet 0   cetirizine-pseudoephedrine (ZYRTEC-D) 5-120 MG tablet Take 1 tablet by mouth daily. 30 tablet 0   Fremanezumab-vfrm (AJOVY) 225 MG/1.5ML SOAJ Inject 225 mg as directed every 30 (thirty) days. 1.5 mL 11   SAXENDA 18 MG/3ML SOPN Inject 3 mg into the skin daily.     valACYclovir (VALTREX) 500 MG tablet      linaclotide (LINZESS) 290 MCG CAPS capsule Take 1 capsule by mouth  daily. (Patient not taking: Reported on 06/05/2022)     No current facility-administered medications for this visit.   Allergies  Allergen Reactions   Imitrex [Sumatriptan] Other (See Comments)    "felt bad, had chest pressure"     Review of Systems: All systems reviewed and negative except where noted in HPI.    No results found.  Physical Exam: BP 110/84   Pulse 87   Ht '5\' 3"'$  (1.6 m)   Wt 256 lb (116.1 kg)   BMI 45.35 kg/m  Constitutional: Pleasant,well-developed, African American female in no acute distress. HEENT: Normocephalic and atraumatic. Conjunctivae are normal. No scleral icterus. Neck supple.  Cardiovascular: Normal rate, regular rhythm.  Pulmonary/chest: Effort normal and breath sounds normal. No wheezing, rales or rhonchi. Abdominal:  Soft, nondistended, nontender. Bowel sounds active throughout. There are no masses palpable. No hepatomegaly. Extremities: no edema Neurological: Alert and oriented to person place and time. Skin: Skin is warm and dry. No rashes noted. Psychiatric: Normal mood and affect. Behavior is normal.  CBC    Component Value Date/Time   WBC 8.1 09/24/2018 1932   RBC 5.49 (H) 09/24/2018 1932   HGB 14.4 09/24/2018 1932   HCT 46.8 (H) 09/24/2018 1932   PLT 358 09/24/2018 1932   MCV 85.2 09/24/2018 1932   MCH 26.2 09/24/2018 1932   MCHC 30.8 09/24/2018 1932   RDW 14.4 09/24/2018 1932   LYMPHSABS 2.6 12/22/2012 1854   MONOABS 1.2 (H) 12/22/2012 1854   EOSABS 0.4 12/22/2012 1854   BASOSABS 0.1 12/22/2012 1854    CMP     Component Value Date/Time   NA 139 09/24/2018 1932   K 3.5 09/24/2018 1932   CL 104 09/24/2018 1932   CO2 27 09/24/2018 1932   GLUCOSE 136 (H) 09/24/2018 1932   BUN <5 (L) 09/24/2018 1932   CREATININE 0.86 09/24/2018 1932   CALCIUM 9.5 09/24/2018 1932   PROT 7.5 12/22/2012 1854   ALBUMIN 3.6 12/22/2012 1854   AST 19 12/22/2012 1854   ALT 12 12/22/2012 1854   ALKPHOS 65 12/22/2012 1854   BILITOT 0.4 12/22/2012 1854   GFRNONAA >60 09/24/2018 1932   GFRAA >60 09/24/2018 1932     ASSESSMENT AND PLAN: 51 year old female with long standing constipation refractory to numerous laxatives including bulk, osmotic, stimulant laxatives, as well as Amitiza and Linzess.  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  Will refer patient for anorectal manometry.  In the meantime, will try prucalopride 2 mg PO daily. She is also overdue for initial screening colonoscopy. Will schedule for colonoscopy.  Colon cancer screening - Colonoscopy  Constipation - Anorectal manometry - Motegrity '2mg'$  PO daily  The details, risks (including bleeding, perforation, infection, missed lesions, medication reactions and possible hospitalization  or surgery if complications occur), benefits, and alternatives to colonoscopy with possible biopsy and possible polypectomy were discussed with the patient and she consents to proceed.   Ronette Hank E. Candis Schatz, MD [facility]   CC:  Everardo Beals, NP

## 2022-06-05 NOTE — Patient Instructions (Addendum)
If you are age 50 or older, your body mass index should be between 23-30. Your Body mass index is 45.35 kg/m. If this is out of the aforementioned range listed, please consider follow up with your Primary Care Provider.  If you are age 80 or younger, your body mass index should be between 19-25. Your Body mass index is 45.35 kg/m. If this is out of the aformentioned range listed, please consider follow up with your Primary Care Provider.   You have been scheduled for a colonoscopy. Please follow written instructions given to you at your visit today.  Please pick up your prep supplies at the pharmacy within the next 1-3 days. If you use inhalers (even only as needed), please bring them with you on the day of your procedure.  We have sent the following medications to your pharmacy for you to pick up at your convenience: Motegrity 2 mg daily.   You have been scheduled to have an anorectal manometry at [facility] on 09/10/22 at 10:30am. Please arrive 30 minutes prior to your appointment time for registration (1st floor of the hospital-admissions).  Please make certain to use 1 Fleets enema 2 hours prior to coming for your appointment. You can purchase Fleets enemas from the laxative section at your drug store. You should not eat anything during the two hours prior to the procedure. You may take regular medications with small sips of water at least 2 hours prior to the study.  Anorectal manometry is a test performed to evaluate patients with constipation or fecal incontinence. This test measures the pressures of the anal sphincter muscles, the sensation in the rectum, and the neural reflexes that are needed for normal bowel movements.  THE PROCEDURE The test takes approximately 30 minutes to 1 hour. You will be asked to change into a hospital gown. A technician or nurse will explain the procedure to you, take a brief health history, and answer any questions you may have. The patient then  lies on his or her left side. A small, flexible tube, about the size of a thermometer, with a balloon at the end is inserted into the rectum. The catheter is connected to a machine that measures the pressure. During the test, the small balloon attached to the catheter may be inflated in the rectum to assess the normal reflex pathways. The nurse or technician may also ask the person to squeeze, relax, and push at various times. The anal sphincter muscle pressures are measured during each of these maneuvers. To squeeze, the patient tightens the sphincter muscles as if trying to prevent anything from coming out. To push or bear down, the patient strains down as if trying to have a bowel movement.    The  GI providers would like to encourage you to use [facility] to communicate with providers for non-urgent requests or questions.  Due to long hold times on the telephone, sending your provider a message by [facility] may be a faster and more efficient way to get a response.  Please allow 48 business hours for a response.  Please remember that this is for non-urgent requests.   It was a pleasure to see you today!  Thank you for trusting me with your gastrointestinal care!    Scott E.Candis Schatz, MD

## 2022-06-25 ENCOUNTER — Ambulatory Visit: Payer: BC Managed Care – PPO | Admitting: Adult Health

## 2022-07-02 ENCOUNTER — Encounter: Payer: BC Managed Care – PPO | Admitting: Gastroenterology

## 2022-08-01 ENCOUNTER — Encounter: Payer: Self-pay | Admitting: Gastroenterology

## 2022-08-08 ENCOUNTER — Encounter: Payer: Self-pay | Admitting: Gastroenterology

## 2022-08-08 ENCOUNTER — Ambulatory Visit (AMBULATORY_SURGERY_CENTER): Payer: BC Managed Care – PPO | Admitting: Gastroenterology

## 2022-08-08 VITALS — BP 120/76 | HR 68 | Temp 97.5°F | Resp 14 | Ht 63.0 in | Wt 256.0 lb

## 2022-08-08 DIAGNOSIS — Z1211 Encounter for screening for malignant neoplasm of colon: Secondary | ICD-10-CM | POA: Diagnosis present

## 2022-08-08 DIAGNOSIS — D120 Benign neoplasm of cecum: Secondary | ICD-10-CM

## 2022-08-08 DIAGNOSIS — D122 Benign neoplasm of ascending colon: Secondary | ICD-10-CM

## 2022-08-08 DIAGNOSIS — D123 Benign neoplasm of transverse colon: Secondary | ICD-10-CM

## 2022-08-08 DIAGNOSIS — K5900 Constipation, unspecified: Secondary | ICD-10-CM

## 2022-08-08 DIAGNOSIS — K635 Polyp of colon: Secondary | ICD-10-CM | POA: Diagnosis not present

## 2022-08-08 MED ORDER — SODIUM CHLORIDE 0.9 % IV SOLN: 500.0000 mL | INTRAVENOUS | Status: DC

## 2022-08-08 NOTE — Progress Notes (Signed)
Called to room to assist during endoscopic procedure.  Patient ID and intended procedure confirmed with present staff. Received instructions for my participation in the procedure from the performing physician.  

## 2022-08-08 NOTE — Progress Notes (Signed)
Pt's states no medical or surgical changes since previsit or office visit. 

## 2022-08-08 NOTE — Patient Instructions (Signed)
Resume previous diet and medications. Awaiting pathology results. Repeat Colonoscopy date to be determined based on pathology results.  YOU HAD AN ENDOSCOPIC PROCEDURE TODAY AT [facility]:   Refer to the procedure report that was given to you for any specific questions about what was found during the examination.  If the procedure report does not answer your questions, please call your gastroenterologist to clarify.  If you requested that your care partner not be given the details of your procedure findings, then the procedure report has been included in a sealed envelope for you to review at your convenience later.  YOU SHOULD EXPECT: Some feelings of bloating in the abdomen. Passage of more gas than usual.  Walking can help get rid of the air that was put into your GI tract during the procedure and reduce the bloating. If you had a lower endoscopy (such as a colonoscopy or flexible sigmoidoscopy) you may notice spotting of blood in your stool or on the toilet paper. If you underwent a bowel prep for your procedure, you may not have a normal bowel movement for a few days.  Please Note:  You might notice some irritation and congestion in your nose or some drainage.  This is from the oxygen used during your procedure.  There is no need for concern and it should clear up in a day or so.  SYMPTOMS TO REPORT IMMEDIATELY:  Following lower endoscopy (colonoscopy or flexible sigmoidoscopy):  Excessive amounts of blood in the stool  Significant tenderness or worsening of abdominal pains  Swelling of the abdomen that is new, acute  Fever of 100F or higher   For urgent or emergent issues, a gastroenterologist can be reached at any hour by calling [phone]. Do not use MyChart messaging for urgent concerns.    DIET:  We do recommend a small meal at first, but then you may proceed to your regular diet.  Drink plenty of fluids but you should avoid alcoholic beverages for 24  hours.  ACTIVITY:  You should plan to take it easy for the rest of today and you should NOT DRIVE or use heavy machinery until tomorrow (because of the sedation medicines used during the test).    FOLLOW UP: Our staff will call the number listed on your records the next business day following your procedure.  We will call around 7:15- 8:00 am to check on you and address any questions or concerns that you may have regarding the information given to you following your procedure. If we do not reach you, we will leave a message.  If you develop any symptoms (ie: fever, flu-like symptoms, shortness of breath, cough etc.) before then, please call [phone].  If you test positive for Covid 19 in the 2 weeks post procedure, please call and report this information to [place].    If any biopsies were taken you will be contacted by phone or by letter within the next 1-3 weeks.  Please call us at [phone] if you have not heard about the biopsies in 3 weeks.    SIGNATURES/CONFIDENTIALITY: You and/or your care partner have signed paperwork which will be entered into your electronic medical record.  These signatures attest to the fact that that the information above on your After Visit Summary has been reviewed and is understood.  Full responsibility of the confidentiality of this discharge information lies with you and/or your care-partner.

## 2022-08-08 NOTE — Op Note (Signed)
Veteran Patient Name: Wanda [name] Procedure Date: 08/08/2022 9:48 AM MRN: [id number] Endoscopist: Nicki Reaper E. Candis Schatz , MD Age: 51 Referring MD:  Date of Birth: 1971/04/16 Gender: Female Account #: [id number] Procedure:                Colonoscopy Indications:              Screening for colorectal malignant neoplasm, This                            is the patient's first colonoscopy Medicines:                Monitored Anesthesia Care Procedure:                Pre-Anesthesia Assessment:                           - Prior to the procedure, a History and Physical                            was performed, and patient medications and                            allergies were reviewed. The patient's tolerance of                            previous anesthesia was also reviewed. The risks                            and benefits of the procedure and the sedation                            options and risks were discussed with the patient.                            All questions were answered, and informed consent                            was obtained. Prior Anticoagulants: The patient has                            taken no previous anticoagulant or antiplatelet                            agents. ASA Grade Assessment: III - A patient with                            severe systemic disease. After reviewing the risks                            and benefits, the patient was deemed in                            satisfactory condition to undergo the procedure.  After obtaining informed consent, the colonoscope                            was passed under direct vision. Throughout the                            procedure, the patient's blood pressure, pulse, and                            oxygen saturations were monitored continuously. The                            CF [id number] #[id number] was introduced through the anus                            and advanced to  the the terminal ileum, with                            identification of the appendiceal orifice and IC                            valve. The colonoscopy was performed without                            difficulty. The colonoscopy was performed without                            difficulty. The patient tolerated the procedure                            well. The quality of the bowel preparation was                            good. The terminal ileum, ileocecal valve,                            appendiceal orifice, and rectum were photographed.                            The bowel preparation used was SUPREP via split                            dose instruction. Scope In: 9:58:43 AM Scope Out: 10:20:05 AM Scope Withdrawal Time: 0 hours 12 minutes 56 seconds  Total Procedure Duration: 0 hours 21 minutes 22 seconds  Findings:                 The digital rectal exam was normal. Pertinent                            negatives include normal sphincter tone and no                            palpable rectal lesions.  Hemorrhoids were found on perianal exam.                           Two sessile polyps were found in the cecum. The                            polyps were 3 to 4 mm in size. These polyps were                            removed with a cold snare. Resection and retrieval                            were complete. Estimated blood loss was minimal.                           A 4 mm polyp was found in the ascending colon. The                            polyp was sessile. The polyp was removed with a                            cold snare. Resection and retrieval were complete.                            Estimated blood loss was minimal.                           A 3 mm polyp was found in the hepatic flexure. The                            polyp was sessile. The polyp was removed with a                            cold snare. Resection and retrieval were complete.                             Estimated blood loss was minimal.                           The exam was otherwise normal throughout the                            examined colon.                           The terminal ileum appeared normal.                           The retroflexed view of the distal rectum and anal                            verge was normal and showed no anal or rectal  abnormalities. Complications:            No immediate complications. Estimated Blood Loss:     Estimated blood loss was minimal. Impression:               - Hemorrhoids found on perianal exam.                           - Two 3 to 4 mm polyps in the cecum, removed with a                            cold snare. Resected and retrieved.                           - One 4 mm polyp in the ascending colon, removed                            with a cold snare. Resected and retrieved.                           - One 3 mm polyp at the hepatic flexure, removed                            with a cold snare. Resected and retrieved.                           - The examined portion of the ileum was normal.                           - The distal rectum and anal verge are normal on                            retroflexion view. Recommendation:           - Patient has a contact number available for                            emergencies. The signs and symptoms of potential                            delayed complications were discussed with the                            patient. Return to normal activities tomorrow.                            Written discharge instructions were provided to the                            patient.                           - Resume previous diet.                           - Continue present  medications.                           - Await pathology results.                           - Repeat colonoscopy (date not yet determined) for                            surveillance based  on pathology results. Anias Bartol E. Candis Schatz, MD 08/08/2022 10:27:10 AM This report has been signed electronically.

## 2022-08-08 NOTE — Progress Notes (Signed)
Sedate, gd SR, tolerated procedure well, VSS, report to RN 

## 2022-08-08 NOTE — Progress Notes (Signed)
[facility] History and Physical   Primary Care Physician:  Everardo Beals, NP   Reason for Procedure:   Colon cancer screening  Plan:    Screening colonoscopy     HPI: Wanda [name] is a 51 y.o. female undergoing initial average risk screening colonoscopy.  She has no family history of colon cancer.  She has chronic constipation, but no other chronic GI symptoms.    Past Medical History:  Diagnosis Date   Hypertension    Migraine    Obesity    Sleep apnea    has a c-pap    Past Surgical History:  Procedure Laterality Date   CESAREAN SECTION     CHOLECYSTECTOMY     ENDOMETRIAL ABLATION      Prior to Admission medications   Medication Sig Start Date End Date Taking? Authorizing Provider  amLODipine (NORVASC) 5 MG tablet TAKE ONE TABLET BY MOUTH EVERY DAY 07/17/11  Yes Lowne Lyndal Pulley R, DO  cetirizine-pseudoephedrine (ZYRTEC-D) 5-120 MG tablet Take 1 tablet by mouth daily. 04/29/20  Yes Faustino Congress, NP  linaclotide Rolan Lipa) 290 MCG CAPS capsule Take 1 capsule by mouth daily. 03/12/22  Yes [provider]  lubiprostone (AMITIZA) 8 MCG capsule Take 1 capsule by mouth 2 (two) times daily.   Yes [provider]  albuterol (VENTOLIN HFA) 108 (90 Base) MCG/ACT inhaler TAKE 2 PUFFS BY MOUTH EVERY 4 HOURS AS NEEDED    [provider]  Fremanezumab-vfrm (AJOVY) 225 MG/1.5ML SOAJ Inject 225 mg as directed every 30 (thirty) days. 12/18/21   Ward Givens, NP  levocetirizine (XYZAL) 5 MG tablet Take 5 mg by mouth daily. 03/11/22   [provider]  SAXENDA 18 MG/3ML SOPN Inject 3 mg into the skin daily. 11/13/21   [provider]  valACYclovir (VALTREX) 500 MG tablet  10/25/16   [provider]    Current Outpatient Medications  Medication Sig Dispense Refill   amLODipine (NORVASC) 5 MG tablet TAKE ONE TABLET BY MOUTH EVERY DAY 30 tablet 0   cetirizine-pseudoephedrine (ZYRTEC-D) 5-120 MG tablet Take 1  tablet by mouth daily. 30 tablet 0   linaclotide (LINZESS) 290 MCG CAPS capsule Take 1 capsule by mouth daily.     lubiprostone (AMITIZA) 8 MCG capsule Take 1 capsule by mouth 2 (two) times daily.     albuterol (VENTOLIN HFA) 108 (90 Base) MCG/ACT inhaler TAKE 2 PUFFS BY MOUTH EVERY 4 HOURS AS NEEDED     Fremanezumab-vfrm (AJOVY) 225 MG/1.5ML SOAJ Inject 225 mg as directed every 30 (thirty) days. 1.5 mL 11   levocetirizine (XYZAL) 5 MG tablet Take 5 mg by mouth daily.     SAXENDA 18 MG/3ML SOPN Inject 3 mg into the skin daily.     valACYclovir (VALTREX) 500 MG tablet      Current Facility-Administered Medications  Medication Dose Route Frequency Provider Last Rate Last Admin   0.9 %  sodium chloride infusion  500 mL Intravenous Continuous Daryel November, MD        Allergies as of 08/08/2022 - Review Complete 08/08/2022  Allergen Reaction Noted   Imitrex [sumatriptan] Other (See Comments) 12/15/2018    Family History  Problem Relation Age of Onset   Breast cancer Mother    Kidney disease Brother        dialysis passed away 70 yrs   Cancer Maternal Uncle        Breast cancer   Cancer Maternal Grandmother        Breast  cancer   Migraines Daughter    Migraines Daughter    Migraines Son    Breast cancer Other        1st degree relative< 1   Hypertension Other    Colon cancer Neg Hx    Colon polyps Neg Hx    Esophageal cancer Neg Hx     Social History   Socioeconomic History   Marital status: Divorced    Spouse name: Not on file   Number of children: 3   Years of education: BA   Highest education level: Not on file  Occupational History   Occupation: [employer]   Occupation: Aeronautical engineer: [employer]    Comment: and Copywriter, advertising: [employer]  Tobacco Use   Smoking status: Never   Smokeless tobacco: Never  Vaping Use   Vaping Use: Never used  Substance and Sexual Activity   Alcohol use: Not Currently    Comment: wine  occassionally   Drug use: No   Sexual activity: Not on file  Other Topics Concern   Not on file  Social History Narrative   Lives at home w/ her dog   Right-handed   Caffeine: rare   Social Determinants of Health   Financial Resource Strain: Not on file  Food Insecurity: Not on file  Transportation Needs: Not on file  Physical Activity: Not on file  Stress: Not on file  Social Connections: Not on file  Intimate Partner Violence: Not on file    Review of Systems:  All other review of systems negative except as mentioned in the HPI.  Physical Exam: Vital signs BP 128/76   Pulse 75   Temp (!) 97.5 F (36.4 C) (Temporal)   Ht '5\' 3"'$  (1.6 m)   Wt 256 lb (116.1 kg)   SpO2 98%   BMI 45.35 kg/m   General:   Alert,  Well-developed, well-nourished, pleasant and cooperative in NAD Airway:  Mallampati 2 Lungs:  Clear throughout to auscultation.   Heart:  Regular rate and rhythm; no murmurs, clicks, rubs,  or gallops. Abdomen:  Soft, nontender and nondistended. Normal bowel sounds.   Neuro/Psych:  Normal mood and affect. A and O x 3   Viola Kinnick E. Candis Schatz, MD [facility]

## 2022-08-11 ENCOUNTER — Telehealth: Payer: Self-pay

## 2022-08-11 NOTE — Telephone Encounter (Signed)
  Follow up Call-     08/08/2022    8:55 AM  Call back number  Post procedure Call Back phone  # [phone]  Permission to leave phone message Yes     Patient questions:  Do you have a fever, pain , or abdominal swelling? No. Pain Score  0 *  Have you tolerated food without any problems? Yes.    Have you been able to return to your normal activities? Yes.    Do you have any questions about your discharge instructions: Diet   No. Medications  No. Follow up visit  No.  Do you have questions or concerns about your Care? No.  Actions: * If pain score is 4 or above: No action needed, pain <4.

## 2022-08-13 NOTE — Progress Notes (Signed)
Wanda [name],  Good news: the polyps that I removed during your recent examination were NOT precancerous.  You should continue to follow current colorectal cancer screening guidelines with a repeat colonoscopy in 10 years.    If you develop any new rectal bleeding, abdominal pain or significant bowel habit changes, please contact me before then.

## 2022-09-03 ENCOUNTER — Telehealth: Payer: Self-pay | Admitting: Gastroenterology

## 2022-09-03 NOTE — Telephone Encounter (Signed)
Patient called states she do not want to proceed with the procedure due to financial issues. Please cancel the appointment.

## 2022-09-03 NOTE — Telephone Encounter (Signed)
Appt cancelled per pt request. She was scheduled for 09/10/22 for ano-rectal manometry. She cancelled due to financial issues. Dr. Candis Schatz aware.

## 2022-09-10 ENCOUNTER — Ambulatory Visit (HOSPITAL_COMMUNITY)
Admission: RE | Admit: 2022-09-10 | Payer: BC Managed Care – PPO | Source: Home / Self Care | Admitting: Gastroenterology

## 2022-09-10 ENCOUNTER — Encounter (HOSPITAL_COMMUNITY): Admission: RE | Payer: Self-pay | Source: Home / Self Care

## 2022-09-10 SURGERY — MANOMETRY, ANORECTAL

## 2022-09-24 ENCOUNTER — Other Ambulatory Visit (HOSPITAL_BASED_OUTPATIENT_CLINIC_OR_DEPARTMENT_OTHER): Payer: Self-pay

## 2022-09-24 MED ORDER — WEGOVY 0.25 MG/0.5ML ~~LOC~~ SOAJ
0.2500 mg | SUBCUTANEOUS | 1 refills | Status: DC
  Filled 2022-09-24: qty 2, 28d supply, fill #0
  Filled 2022-12-21: qty 2, 28d supply, fill #1

## 2022-10-08 ENCOUNTER — Other Ambulatory Visit (HOSPITAL_BASED_OUTPATIENT_CLINIC_OR_DEPARTMENT_OTHER): Payer: Self-pay

## 2022-12-08 ENCOUNTER — Other Ambulatory Visit: Payer: Self-pay | Admitting: Obstetrics and Gynecology

## 2022-12-08 DIAGNOSIS — N644 Mastodynia: Secondary | ICD-10-CM

## 2022-12-09 ENCOUNTER — Ambulatory Visit: Payer: BC Managed Care – PPO

## 2022-12-09 ENCOUNTER — Ambulatory Visit
Admission: RE | Admit: 2022-12-09 | Discharge: 2022-12-09 | Disposition: A | Discharge status: Home / Self Care | Payer: BC Managed Care – PPO | Source: Ambulatory Visit | Attending: Obstetrics and Gynecology | Admitting: Obstetrics and Gynecology

## 2022-12-09 DIAGNOSIS — N644 Mastodynia: Secondary | ICD-10-CM

## 2022-12-11 ENCOUNTER — Other Ambulatory Visit (HOSPITAL_BASED_OUTPATIENT_CLINIC_OR_DEPARTMENT_OTHER): Payer: Self-pay

## 2022-12-12 ENCOUNTER — Other Ambulatory Visit (HOSPITAL_BASED_OUTPATIENT_CLINIC_OR_DEPARTMENT_OTHER): Payer: Self-pay

## 2022-12-17 ENCOUNTER — Other Ambulatory Visit: Payer: Self-pay

## 2022-12-22 ENCOUNTER — Other Ambulatory Visit (HOSPITAL_BASED_OUTPATIENT_CLINIC_OR_DEPARTMENT_OTHER): Payer: Self-pay

## 2023-01-01 ENCOUNTER — Other Ambulatory Visit (HOSPITAL_BASED_OUTPATIENT_CLINIC_OR_DEPARTMENT_OTHER): Payer: Self-pay

## 2023-01-01 ENCOUNTER — Other Ambulatory Visit: Payer: Self-pay

## 2023-01-01 MED ORDER — WEGOVY 0.5 MG/0.5ML ~~LOC~~ SOAJ
0.5000 mg | SUBCUTANEOUS | 1 refills | Status: DC
  Filled 2023-01-09 – 2023-01-28 (×2): qty 2, 28d supply, fill #0

## 2023-01-05 ENCOUNTER — Other Ambulatory Visit (HOSPITAL_BASED_OUTPATIENT_CLINIC_OR_DEPARTMENT_OTHER): Payer: Self-pay

## 2023-01-05 ENCOUNTER — Other Ambulatory Visit: Payer: Self-pay

## 2023-01-09 ENCOUNTER — Other Ambulatory Visit (HOSPITAL_BASED_OUTPATIENT_CLINIC_OR_DEPARTMENT_OTHER): Payer: Self-pay

## 2023-01-28 ENCOUNTER — Other Ambulatory Visit: Payer: Self-pay

## 2023-02-03 ENCOUNTER — Other Ambulatory Visit: Payer: Self-pay

## 2023-05-18 LAB — HM MAMMOGRAPHY

## 2023-08-10 ENCOUNTER — Ambulatory Visit (INDEPENDENT_AMBULATORY_CARE_PROVIDER_SITE_OTHER): Payer: BC Managed Care – PPO | Admitting: Internal Medicine

## 2023-08-10 ENCOUNTER — Encounter: Payer: Self-pay | Admitting: Internal Medicine

## 2023-08-10 ENCOUNTER — Other Ambulatory Visit (HOSPITAL_COMMUNITY): Payer: Self-pay

## 2023-08-10 ENCOUNTER — Other Ambulatory Visit: Payer: Self-pay

## 2023-08-10 VITALS — BP 155/100 | HR 71 | Temp 98.4°F | Ht 63.0 in | Wt 274.5 lb

## 2023-08-10 DIAGNOSIS — I10 Essential (primary) hypertension: Secondary | ICD-10-CM

## 2023-08-10 DIAGNOSIS — Z1159 Encounter for screening for other viral diseases: Secondary | ICD-10-CM

## 2023-08-10 DIAGNOSIS — Z131 Encounter for screening for diabetes mellitus: Secondary | ICD-10-CM

## 2023-08-10 DIAGNOSIS — E785 Hyperlipidemia, unspecified: Secondary | ICD-10-CM

## 2023-08-10 DIAGNOSIS — G43019 Migraine without aura, intractable, without status migrainosus: Secondary | ICD-10-CM | POA: Diagnosis not present

## 2023-08-10 DIAGNOSIS — R6889 Other general symptoms and signs: Secondary | ICD-10-CM

## 2023-08-10 DIAGNOSIS — Z6841 Body Mass Index (BMI) 40.0 and over, adult: Secondary | ICD-10-CM

## 2023-08-10 DIAGNOSIS — Z0000 Encounter for general adult medical examination without abnormal findings: Secondary | ICD-10-CM

## 2023-08-10 DIAGNOSIS — K219 Gastro-esophageal reflux disease without esophagitis: Secondary | ICD-10-CM

## 2023-08-10 DIAGNOSIS — Z114 Encounter for screening for human immunodeficiency virus [HIV]: Secondary | ICD-10-CM

## 2023-08-10 DIAGNOSIS — G4730 Sleep apnea, unspecified: Secondary | ICD-10-CM

## 2023-08-10 DIAGNOSIS — E1169 Type 2 diabetes mellitus with other specified complication: Secondary | ICD-10-CM

## 2023-08-10 MED ORDER — SEMAGLUTIDE-WEIGHT MANAGEMENT 1.7 MG/0.75ML ~~LOC~~ SOAJ
1.7000 mg | SUBCUTANEOUS | 0 refills | Status: DC
Start: 2023-11-05 — End: 2023-12-03
  Filled 2023-08-10: qty 3, 28d supply, fill #0

## 2023-08-10 MED ORDER — SEMAGLUTIDE-WEIGHT MANAGEMENT 1.7 MG/0.75ML ~~LOC~~ SOAJ
1.7000 mg | SUBCUTANEOUS | 0 refills | Status: DC
Start: 2023-11-05 — End: 2023-08-10

## 2023-08-10 MED ORDER — SEMAGLUTIDE-WEIGHT MANAGEMENT 0.25 MG/0.5ML ~~LOC~~ SOAJ: 0.2500 mg | SUBCUTANEOUS | 0 refills | Status: DC

## 2023-08-10 MED ORDER — SEMAGLUTIDE-WEIGHT MANAGEMENT 1 MG/0.5ML ~~LOC~~ SOAJ
1.0000 mg | SUBCUTANEOUS | 0 refills | Status: DC
Start: 2023-10-07 — End: 2023-11-04
  Filled 2023-08-10: qty 2, 28d supply, fill #0

## 2023-08-10 MED ORDER — TOPIRAMATE 25 MG PO TABS
25.0000 mg | ORAL_TABLET | ORAL | 0 refills | Status: DC
Start: 2023-08-10 — End: 2023-08-10

## 2023-08-10 MED ORDER — SEMAGLUTIDE-WEIGHT MANAGEMENT 0.25 MG/0.5ML ~~LOC~~ SOAJ
0.2500 mg | SUBCUTANEOUS | 0 refills | Status: DC
Start: 2023-08-10 — End: 2023-08-11
  Filled 2023-08-10: qty 2, 28d supply, fill #0

## 2023-08-10 MED ORDER — SEMAGLUTIDE-WEIGHT MANAGEMENT 1 MG/0.5ML ~~LOC~~ SOAJ
1.0000 mg | SUBCUTANEOUS | 0 refills | Status: DC
Start: 2023-10-07 — End: 2023-08-10

## 2023-08-10 MED ORDER — AMLODIPINE BESYLATE 5 MG PO TABS
5.0000 mg | ORAL_TABLET | Freq: Every day | ORAL | 0 refills | Status: DC
  Filled 2023-08-10: qty 30, 30d supply, fill #0

## 2023-08-10 MED ORDER — SEMAGLUTIDE-WEIGHT MANAGEMENT 0.5 MG/0.5ML ~~LOC~~ SOAJ
0.5000 mg | SUBCUTANEOUS | 0 refills | Status: DC
Start: 2023-09-08 — End: 2023-10-06
  Filled 2023-08-10: qty 2, 28d supply, fill #0

## 2023-08-10 MED ORDER — SEMAGLUTIDE-WEIGHT MANAGEMENT 2.4 MG/0.75ML ~~LOC~~ SOAJ
2.4000 mg | SUBCUTANEOUS | 0 refills | Status: DC
Start: 2023-12-04 — End: 2023-08-10

## 2023-08-10 MED ORDER — TOPIRAMATE 25 MG PO TABS
25.0000 mg | ORAL_TABLET | ORAL | 0 refills | Status: DC
Start: 2023-08-10 — End: 2023-09-05
  Filled 2023-08-10: qty 70, 30d supply, fill #0

## 2023-08-10 MED ORDER — HYDROCHLOROTHIAZIDE 25 MG PO TABS
25.0000 mg | ORAL_TABLET | Freq: Every day | ORAL | 3 refills | Status: DC
  Filled 2023-08-10: qty 90, 90d supply, fill #0
  Filled 2023-11-08: qty 90, 90d supply, fill #1
  Filled 2024-04-13: qty 90, 90d supply, fill #2

## 2023-08-10 MED ORDER — SEMAGLUTIDE-WEIGHT MANAGEMENT 2.4 MG/0.75ML ~~LOC~~ SOAJ
2.4000 mg | SUBCUTANEOUS | 0 refills | Status: DC
Start: 2023-12-04 — End: 2024-01-01
  Filled 2023-08-10: qty 3, 28d supply, fill #0

## 2023-08-10 MED ORDER — SEMAGLUTIDE-WEIGHT MANAGEMENT 0.5 MG/0.5ML ~~LOC~~ SOAJ
0.5000 mg | SUBCUTANEOUS | 0 refills | Status: DC
Start: 2023-09-08 — End: 2023-08-10

## 2023-08-10 NOTE — Patient Instructions (Addendum)
[name], it was a pleasure seeing you today! You endorsed feeling well today. Below are some of the things we talked about this visit. We look forward to seeing you in the follow up appointment!  Today we discussed: Continue your current medicines.  We will check lab work today.  We will also check you for covid.  We will get records from your urgent care.   I have ordered the following labs today:  Lab Orders  No laboratory test(s) ordered today      Referrals ordered today:   Referral Orders  No referral(s) requested today     I have ordered the following medication/changed the following medications:   Stop the following medications: There are no discontinued medications.   Start the following medications: No orders of the defined types were placed in this encounter.    Follow-up: 1 month follow up   Please make sure to arrive 15 minutes prior to your next appointment. If you arrive late, you may be asked to reschedule.   We look forward to seeing you next time. Please call our clinic at [phone] if you have any questions or concerns. The best time to call is Monday-Friday from 9am-4pm, but there is someone available 24/7. If after hours or the weekend, call the main hospital number and ask for the Internal Medicine Resident On-Call. If you need medication refills, please notify your pharmacy one week in advance and they will send [place] a request.  Thank you for letting us take part in your care. Wishing you the best!  Thank you, Gwenevere Abbot, MD

## 2023-08-10 NOTE — Progress Notes (Unsigned)
CC: Establishment of care  HPI:  [name] is a 52 y.o. female with a past medical history of HTN, Obesity and presenting to the clinic today for establishment of care. Please see problem based assessment and plan for additional details.  Past Medical Hx: HTN Migraines Sleep Apnea Class III obesity  Medications Norvasc 5 mg every day Hydrochlorothiazide 25 mg every day Vitamin D  Tylenol  Ajovy monthly: 8 months ago  Allergies: NKDA  Family Hx HTN: Mother, Brother DMII: Mother Cancer: Breast cancer in mother, maternal uncle with breast cancer, Maternal GM Migraines: all kids have migraines.    Social History: Lives by self. No tobacco use or illicit substance. Occasional wine use. Independent in ADLs and iADLs. Received previous care at urgent care. 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   Review of Systems: ROS negative except for what is noted on the assessment and plan.  Vitals:   08/10/23 1413 08/10/23 1419  BP: (!) 145/105 (!) 155/100  Pulse: 83 71  Temp: 98.4 F (36.9 C)   TempSrc: Oral   SpO2: 99%   Weight: 274 lb 8 oz (124.5 kg)   Height: 5\' 3"  (1.6 m)      Physical Exam: General: Well appearing , NAD HENT: normocephalic, atraumatic EYES: conjunctiva non-erythematous, no scleral icterus CV: regular rate, normal rhythm, no murmurs, rubs, gallops. Pulmonary: normal work of breathing on RA, lungs clear to auscultation, no rales, wheezes, rhonchi Abdominal: non-distended, soft, non-tender to palpation, normal BS Skin: Warm and dry, no rashes or lesions Neurological: MS: awake, alert and oriented x3, normal speech and fund of knowledge Motor: moves all extremities antigravity Psych: normal affect    Assessment & Plan:   Essential hypertension Pt with HTN that is uncontrolled. She is on amlodipine 5 mg and hydrochlorothiazide 25 mg every day. Did not take her medications yesterday. No recent blood work to check renal fxn. BMP this visit shows  normal renal function. Given her diabetes, will add losartan 50 mg every day in place of amlodipine 5 mg daily. Follow up in one month for repeat BMP.   Health care maintenance HIV and Hep C testing done this visit.  Pt declined flu shot.  Had recent pap and mammogram so will get records.  Recent diagnosis of DM this visit so will need other care gaps at subsequent visit.   Migraine without aura Pt with chronic hx of migraines and was previously on Ajovy monthly injections. Currently reporting 14/30 headache days per month. She wants daily preventive medicine so will start her on topamax and titrate to max dosage. Will follow up in one month. Pt previously on rizatriptan for abortive therapy so will refill this.   Sleep apnea Pt with hx of OSA that is mild to moderate. She was seen by [facility] and recommended CPAP. She does not have this and last sleep study was in 2016. Will refer her to neurology as she may need repeat sleep study.   Diabetes mellitus, type II (HCC) Screening A1c checked and elevated at 6.9 consistent with DMII. Will start pt on trulicity for her diabetes given her obesity and co-morbidities of OSA and HTN. Will start at 0.75 mg dose and titrate monthly as pt tolerates this.   Class 3 severe obesity with serious comorbidity and body mass index (BMI) of 45.0 to 49.9 in adult [facility]) Pt with class III obesity with multiple complications including DMII, HTN and OSA. Will start pt on trulicity as she has failed lifestyle modifications.  Follow up in one month to further increase her trulicity.   Hyperlipidemia associated with type 2 diabetes mellitus (HCC) Screening lipid panel ordered showed pt with elevated LDL 127. Given her hx of DMII, her ASCVD risk is elevated at 16 % so will start pt on Crestor 20 mg every day. Plan to repeat lipid panel at next appointment to assess response.   Flu-like symptoms Pt with flu like symptoms including throat pain, sinus congestion, myalgias that  started yesterday. She reports sick contact in her grandchild. Symptoms are mild but will test her for COVID/Flu/RSV per pt request. Advised continued conservative measures.   Acid reflux Pt reports 2 month hx of acid reflux symptoms. Her symptoms are present after eating and at night time. Advised pt on lifestyle modifications including foods to avoid, and not to lay down immediately after eating. Her symptoms will benefit from weight loss as well. Will follow up at subsequent visit and start PPI trial if lifestyle interventions fail. No red flag symptoms present.    See Encounters Tab for problem based charting.  Patient discussed with Dr. Joanie Coddington, MD Eligha Bridegroom. [facility] Res[facility], PGY-3

## 2023-08-11 ENCOUNTER — Other Ambulatory Visit: Payer: Self-pay

## 2023-08-11 ENCOUNTER — Telehealth: Payer: Self-pay

## 2023-08-11 ENCOUNTER — Other Ambulatory Visit: Payer: Self-pay | Admitting: Internal Medicine

## 2023-08-11 ENCOUNTER — Encounter (HOSPITAL_COMMUNITY): Payer: Self-pay

## 2023-08-11 ENCOUNTER — Other Ambulatory Visit (HOSPITAL_COMMUNITY): Payer: Self-pay

## 2023-08-11 DIAGNOSIS — E1169 Type 2 diabetes mellitus with other specified complication: Secondary | ICD-10-CM

## 2023-08-11 DIAGNOSIS — E119 Type 2 diabetes mellitus without complications: Secondary | ICD-10-CM | POA: Insufficient documentation

## 2023-08-11 DIAGNOSIS — G4730 Sleep apnea, unspecified: Secondary | ICD-10-CM | POA: Insufficient documentation

## 2023-08-11 DIAGNOSIS — Z6841 Body Mass Index (BMI) 40.0 and over, adult: Secondary | ICD-10-CM | POA: Insufficient documentation

## 2023-08-11 DIAGNOSIS — I10 Essential (primary) hypertension: Secondary | ICD-10-CM | POA: Insufficient documentation

## 2023-08-11 DIAGNOSIS — Z0000 Encounter for general adult medical examination without abnormal findings: Secondary | ICD-10-CM | POA: Insufficient documentation

## 2023-08-11 LAB — BMP8+ANION GAP
Anion Gap: 17 mmol/L (ref 10.0–18.0)
BUN/Creatinine Ratio: 10 (ref 9–23)
BUN: 8 mg/dL (ref 6–24)
CO2: 23 mmol/L (ref 20–29)
Calcium: 9.3 mg/dL (ref 8.7–10.2)
Chloride: 101 mmol/L (ref 96–106)
Creatinine, Ser: 0.83 mg/dL (ref 0.57–1.00)
Glucose: 96 mg/dL (ref 70–99)
Potassium: 3.7 mmol/L (ref 3.5–5.2)
Sodium: 141 mmol/L (ref 134–144)
eGFR: 85 mL/min/{1.73_m2} (ref >59)

## 2023-08-11 LAB — HIV ANTIBODY (ROUTINE TESTING W REFLEX): HIV Screen 4th Generation wRfx: NONREACTIVE

## 2023-08-11 LAB — HEMOGLOBIN A1C
Est. average glucose Bld gHb Est-mCnc: 151 mg/dL
Hgb A1c MFr Bld: 6.9 % — ABNORMAL HIGH (ref 4.8–5.6)

## 2023-08-11 LAB — HCV INTERPRETATION

## 2023-08-11 LAB — LIPID PANEL
Chol/HDL Ratio: 3.7 ratio (ref 0.0–4.4)
Cholesterol, Total: 194 mg/dL (ref 100–199)
HDL: 53 mg/dL (ref >39)
LDL Chol Calc (NIH): 127 mg/dL — ABNORMAL HIGH (ref 0–99)
Triglycerides: 77 mg/dL (ref 0–149)
VLDL Cholesterol Cal: 14 mg/dL (ref 5–40)

## 2023-08-11 LAB — HCV AB W REFLEX TO QUANT PCR: HCV Ab: NONREACTIVE

## 2023-08-11 MED ORDER — INSULIN PEN NEEDLE 32G X 4 MM MISC
1.0000 | 0 refills | Status: AC
  Filled 2023-08-11: qty 100, fill #0

## 2023-08-11 MED ORDER — TRULICITY 0.75 MG/0.5ML ~~LOC~~ SOAJ
0.7500 mg | SUBCUTANEOUS | 0 refills | Status: DC
Start: 2023-08-11 — End: 2023-08-20
  Filled 2023-08-11: qty 2, 28d supply, fill #0

## 2023-08-11 MED ORDER — LOSARTAN POTASSIUM 50 MG PO TABS
50.0000 mg | ORAL_TABLET | Freq: Every day | ORAL | 11 refills | Status: DC
Start: 2023-08-11 — End: 2023-09-14
  Filled 2023-08-11: qty 30, 30d supply, fill #0

## 2023-08-11 MED ORDER — ROSUVASTATIN CALCIUM 20 MG PO TABS
20.0000 mg | ORAL_TABLET | Freq: Every day | ORAL | 11 refills | Status: DC
Start: 2023-08-11 — End: 2024-07-04
  Filled 2023-08-11: qty 30, 30d supply, fill #0
  Filled 2023-09-16: qty 30, 30d supply, fill #1
  Filled 2023-11-08: qty 30, 30d supply, fill #2
  Filled 2023-12-13: qty 30, 30d supply, fill #3
  Filled 2024-01-17: qty 30, 30d supply, fill #4
  Filled 2024-02-16: qty 30, 30d supply, fill #5
  Filled 2024-03-14: qty 30, 30d supply, fill #6
  Filled 2024-04-13: qty 30, 30d supply, fill #7
  Filled 2024-05-12: qty 30, 30d supply, fill #8
  Filled 2024-06-08 – 2024-06-26 (×2): qty 30, 30d supply, fill #9

## 2023-08-11 NOTE — Assessment & Plan Note (Signed)
Pt with chronic hx of migraines and was previously on Ajovy monthly injections. Currently reporting 14/30 headache days per month. She wants daily preventive medicine so will start her on topamax and titrate to max dosage. Will follow up in one month. Pt previously on rizatriptan for abortive therapy so will refill this.

## 2023-08-11 NOTE — Assessment & Plan Note (Signed)
Screening lipid panel ordered showed pt with elevated LDL 127. Given her hx of DMII, her ASCVD risk is elevated at 16 % so will start pt on Crestor 20 mg every day. Plan to repeat lipid panel at next appointment to assess response.

## 2023-08-11 NOTE — Addendum Note (Signed)
Addended by: Gwenevere Abbot on: 08/11/2023 01:50 PM   Modules accepted: Orders

## 2023-08-11 NOTE — Telephone Encounter (Signed)
Pt is requesting a call back ... She saw Dr Welton Flakes  yesterday ... She stated that the wegovy is not covered it is over $1000 ...  She is wanting to switch the med to something  else

## 2023-08-11 NOTE — Assessment & Plan Note (Signed)
Screening A1c checked and elevated at 6.9 consistent with DMII. Will start pt on trulicity for her diabetes given her obesity and co-morbidities of OSA and HTN. Will start at 0.75 mg dose and titrate monthly as pt tolerates this.

## 2023-08-11 NOTE — Assessment & Plan Note (Signed)
Pt with class III obesity with multiple complications including DMII, HTN and OSA. Will start pt on trulicity as she has failed lifestyle modifications. Follow up in one month to further increase her trulicity.

## 2023-08-11 NOTE — Assessment & Plan Note (Signed)
Pt with hx of OSA that is mild to moderate. She was seen by [facility] and recommended CPAP. She does not have this and last sleep study was in 2016. Will refer her to neurology as she may need repeat sleep study.

## 2023-08-11 NOTE — Assessment & Plan Note (Signed)
HIV and Hep C testing done this visit.  Pt declined flu shot.  Had recent pap and mammogram so will get records.  Recent diagnosis of DM this visit so will need other care gaps at subsequent visit.

## 2023-08-11 NOTE — Assessment & Plan Note (Signed)
Pt with HTN that is uncontrolled. She is on amlodipine 5 mg and hydrochlorothiazide 25 mg every day. Did not take her medications yesterday. No recent blood work to check renal fxn. BMP this visit shows normal renal function. Given her diabetes, will add losartan 50 mg every day in place of amlodipine 5 mg daily. Follow up in one month for repeat BMP.

## 2023-08-12 DIAGNOSIS — R6889 Other general symptoms and signs: Secondary | ICD-10-CM | POA: Insufficient documentation

## 2023-08-12 DIAGNOSIS — K219 Gastro-esophageal reflux disease without esophagitis: Secondary | ICD-10-CM | POA: Insufficient documentation

## 2023-08-12 LAB — COVID-19, FLU A+B AND RSV
Influenza A, NAA: NOT DETECTED
Influenza B, NAA: NOT DETECTED
RSV, NAA: NOT DETECTED
SARS-CoV-2, NAA: NOT DETECTED

## 2023-08-12 NOTE — Assessment & Plan Note (Signed)
Pt with flu like symptoms including throat pain, sinus congestion, myalgias that started yesterday. She reports sick contact in her grandchild. Symptoms are mild but will test her for COVID/Flu/RSV per pt request. Advised continued conservative measures.

## 2023-08-12 NOTE — Assessment & Plan Note (Signed)
Pt reports 2 month hx of acid reflux symptoms. Her symptoms are present after eating and at night time. Advised pt on lifestyle modifications including foods to avoid, and not to lay down immediately after eating. Her symptoms will benefit from weight loss as well. Will follow up at subsequent visit and start PPI trial if lifestyle interventions fail. No red flag symptoms present.

## 2023-08-13 ENCOUNTER — Telehealth: Payer: Self-pay

## 2023-08-13 NOTE — Telephone Encounter (Signed)
Pt is requesting a call back ... She is requesting a meter to test  her blood sugars she stated that at her visit with Dr Welton Flakes he told her she is diabetic and she is wanting a meter  now

## 2023-08-14 ENCOUNTER — Other Ambulatory Visit: Payer: Self-pay

## 2023-08-14 ENCOUNTER — Other Ambulatory Visit: Payer: Self-pay | Admitting: Internal Medicine

## 2023-08-14 ENCOUNTER — Other Ambulatory Visit (HOSPITAL_COMMUNITY): Payer: Self-pay

## 2023-08-14 DIAGNOSIS — E1169 Type 2 diabetes mellitus with other specified complication: Secondary | ICD-10-CM

## 2023-08-14 MED ORDER — LANCET DEVICE MISC
1.0000 | Freq: Three times a day (TID) | 0 refills | Status: AC
Start: 2023-08-14 — End: 2023-09-13
  Filled 2023-08-14: qty 1, 30d supply, fill #0

## 2023-08-14 MED ORDER — ACCU-CHEK SOFTCLIX LANCETS MISC
1.0000 | Freq: Every day | 0 refills | Status: AC | PRN
Start: 2023-08-14 — End: 2023-09-16
  Filled 2023-08-14: qty 100, 30d supply, fill #0

## 2023-08-14 MED ORDER — BLOOD GLUCOSE MONITOR SYSTEM W/DEVICE KIT
1.0000 | PACK | Freq: Three times a day (TID) | 0 refills | Status: DC
Start: 2023-08-14 — End: 2024-03-14
  Filled 2023-08-14: qty 1, 1d supply, fill #0

## 2023-08-14 MED ORDER — BLOOD GLUCOSE TEST VI STRP
1.0000 | ORAL_STRIP | Freq: Every day | 0 refills | Status: DC | PRN
Start: 2023-08-14 — End: 2024-05-02
  Filled 2023-08-14: qty 100, 30d supply, fill #0

## 2023-08-17 ENCOUNTER — Other Ambulatory Visit (HOSPITAL_COMMUNITY): Payer: Self-pay

## 2023-08-18 ENCOUNTER — Telehealth: Payer: Self-pay | Admitting: *Deleted

## 2023-08-18 NOTE — Telephone Encounter (Signed)
Call from patient states medication is making her sick.  Patient was asked which medication stated could be the pills or the shot.  Patient states she started Losartan last week and Trulicity on Sunday.  Is vomiting and no appetite.

## 2023-08-20 ENCOUNTER — Other Ambulatory Visit: Payer: Self-pay | Admitting: Internal Medicine

## 2023-08-20 ENCOUNTER — Other Ambulatory Visit (HOSPITAL_COMMUNITY): Payer: Self-pay

## 2023-08-20 DIAGNOSIS — E1169 Type 2 diabetes mellitus with other specified complication: Secondary | ICD-10-CM

## 2023-08-20 MED ORDER — OZEMPIC (0.25 OR 0.5 MG/DOSE) 2 MG/3ML ~~LOC~~ SOPN
0.2500 mg | PEN_INJECTOR | SUBCUTANEOUS | 0 refills | Status: DC
  Filled 2023-08-20: qty 3, 28d supply, fill #0

## 2023-08-20 NOTE — Progress Notes (Signed)
Pt with GI symptoms with starting trulicity. She states she tolerated wegovy fine previously. Will change trulicity to ozempic.

## 2023-08-22 NOTE — Addendum Note (Signed)
Addended by: Debe Coder B on: 08/22/2023 09:19 AM   Modules accepted: Level of Service

## 2023-08-22 NOTE — Progress Notes (Signed)
Internal Medicine Clinic Attending  Case discussed with the resident at the time of the visit.  We reviewed the resident's history and exam and pertinent patient test results.  I agree with the assessment, diagnosis, and plan of care documented in the resident's note.  Debe Coder, MD

## 2023-08-26 NOTE — Telephone Encounter (Signed)
Pt is requesting a call back .Marland Kitchen She stated that she thinks she is having a allergic  reaction to one of her meds ( DOES NOT KNOW WHICH ONE )  reason being is she is having  numbness in her  lips  and itchy throat she did use  OTC  which stopped the itching .Marland Kitchen But her lips are still numb

## 2023-08-27 NOTE — Telephone Encounter (Signed)
RTC to patient states has stopped taking her Crestor and Losartan.   Has stopped having the symptoms of the itchy lips and throat.  Still does not know which of the 2 medications was causing the problem. Is awaiting a call from the doctor to talk about which medication could be causing the problem.

## 2023-08-27 NOTE — Telephone Encounter (Signed)
I spoke with Wanda [name] on the phone. Patient's identity was confirmed using two patient specific identifiers. We discussed the patient's symptoms she was having.  She states that she started having what she felt was an allergic reaction to the medication, and stopped taking it yesterday.  She says she persistently has a bit of itchiness in the throat, but otherwise denies any shortness of breath, difficulty breathing, swelling of the lips tongue or throat.  I instructed the patient to continue holding the medications she felt were causing these issues.  We will see her in clinic for an appointment at the next possible date to further discuss, and find a medication regiment which will work for her.  Strict precautions given to go to the ED if the patient has signs of hypertensive urgency such as general malaise, fatigue, weakness, or any other abnormal or concerning symptom.  Strict precautions were given to go to the ED if the patient has any increase in swelling of the lips, tongue, or throat as well as any progression of her symptoms.

## 2023-09-05 ENCOUNTER — Other Ambulatory Visit: Payer: Self-pay | Admitting: Internal Medicine

## 2023-09-05 DIAGNOSIS — G43019 Migraine without aura, intractable, without status migrainosus: Secondary | ICD-10-CM

## 2023-09-07 NOTE — Telephone Encounter (Signed)
Next appt scheduled 10/14 with Dr Geraldo Pitter.

## 2023-09-09 MED ORDER — TOPIRAMATE 25 MG PO TABS
25.0000 mg | ORAL_TABLET | ORAL | 0 refills | Status: DC
  Filled 2023-09-09: qty 70, 30d supply, fill #0

## 2023-09-10 ENCOUNTER — Other Ambulatory Visit (HOSPITAL_COMMUNITY): Payer: Self-pay

## 2023-09-14 ENCOUNTER — Other Ambulatory Visit (HOSPITAL_COMMUNITY): Payer: Self-pay

## 2023-09-14 ENCOUNTER — Ambulatory Visit: Payer: BC Managed Care – PPO | Admitting: Student

## 2023-09-14 ENCOUNTER — Encounter: Payer: Self-pay | Admitting: Student

## 2023-09-14 ENCOUNTER — Other Ambulatory Visit: Payer: Self-pay

## 2023-09-14 VITALS — BP 122/79 | HR 81 | Temp 98.2°F | Ht 63.0 in | Wt 267.5 lb

## 2023-09-14 DIAGNOSIS — E1169 Type 2 diabetes mellitus with other specified complication: Secondary | ICD-10-CM

## 2023-09-14 DIAGNOSIS — Z131 Encounter for screening for diabetes mellitus: Secondary | ICD-10-CM

## 2023-09-14 DIAGNOSIS — G43019 Migraine without aura, intractable, without status migrainosus: Secondary | ICD-10-CM

## 2023-09-14 DIAGNOSIS — Z7985 Long-term (current) use of injectable non-insulin antidiabetic drugs: Secondary | ICD-10-CM

## 2023-09-14 DIAGNOSIS — E785 Hyperlipidemia, unspecified: Secondary | ICD-10-CM

## 2023-09-14 DIAGNOSIS — I10 Essential (primary) hypertension: Secondary | ICD-10-CM | POA: Diagnosis not present

## 2023-09-14 MED ORDER — RIZATRIPTAN BENZOATE 10 MG PO TABS
10.0000 mg | ORAL_TABLET | Freq: Once | ORAL | 2 refills | Status: AC | PRN
Start: 2023-09-14 — End: ?
  Filled 2023-09-14: qty 15, 30d supply, fill #0
  Filled 2024-03-14: qty 15, 30d supply, fill #1

## 2023-09-14 MED ORDER — TRULICITY 0.75 MG/0.5ML ~~LOC~~ SOAJ
0.7500 mg | SUBCUTANEOUS | 0 refills | Status: DC
Start: 2023-09-14 — End: 2023-12-04
  Filled 2023-09-14: qty 2, 28d supply, fill #0

## 2023-09-14 MED ORDER — EMPAGLIFLOZIN 10 MG PO TABS
10.0000 mg | ORAL_TABLET | Freq: Every day | ORAL | 3 refills | Status: DC
Start: 2023-09-14 — End: 2023-09-14
  Filled 2023-09-14: qty 30, 30d supply, fill #0

## 2023-09-14 MED ORDER — LOSARTAN POTASSIUM 50 MG PO TABS
50.0000 mg | ORAL_TABLET | Freq: Every day | ORAL | 11 refills | Status: DC
  Filled 2023-09-14: qty 30, 30d supply, fill #0
  Filled 2023-11-08: qty 30, 30d supply, fill #1
  Filled 2023-12-13: qty 30, 30d supply, fill #2
  Filled 2024-01-17: qty 30, 30d supply, fill #3
  Filled 2024-02-16: qty 30, 30d supply, fill #4
  Filled 2024-03-14: qty 30, 30d supply, fill #5
  Filled 2024-04-13: qty 30, 30d supply, fill #6
  Filled 2024-05-12: qty 30, 30d supply, fill #7
  Filled 2024-06-08 – 2024-06-26 (×2): qty 30, 30d supply, fill #8

## 2023-09-14 MED ORDER — NURTEC 75 MG PO TBDP
75.0000 mg | ORAL_TABLET | ORAL | 2 refills | Status: DC
Start: 2023-09-14 — End: 2024-07-04
  Filled 2023-09-14: qty 16, 30d supply, fill #0
  Filled 2023-09-16: qty 9, 30d supply, fill #0
  Filled 2023-09-16: qty 30, 30d supply, fill #0
  Filled 2023-09-16: qty 16, 30d supply, fill #0
  Filled 2023-09-24: qty 30, 60d supply, fill #0

## 2023-09-14 MED ORDER — TRULICITY 1.5 MG/0.5ML ~~LOC~~ SOAJ
1.5000 mg | SUBCUTANEOUS | 1 refills | Status: DC
Start: 2023-10-12 — End: 2023-12-04
  Filled 2023-09-14 – 2023-10-09 (×3): qty 2, 28d supply, fill #0
  Filled 2023-11-01 – 2023-11-03 (×2): qty 2, 28d supply, fill #1

## 2023-09-14 MED ORDER — AMLODIPINE BESYLATE 5 MG PO TABS
5.0000 mg | ORAL_TABLET | Freq: Every day | ORAL | 11 refills | Status: DC
Start: 2023-09-14 — End: 2023-09-14
  Filled 2023-09-14: qty 30, 30d supply, fill #0

## 2023-09-14 NOTE — Assessment & Plan Note (Signed)
Due to the side effects she had after starting topiramate at the same time she started the rosuvastatin she has not taken this medication in 2 weeks.  We will hold off on repeating her lipid panel at this time and she will resume her rosuvastatin. - Resume rosuvastatin 20 mg daily and repeat lipid panel at next visit

## 2023-09-14 NOTE — Assessment & Plan Note (Signed)
At her last visit she was started on topiramate for migraine prevention but unfortunately had paresthesias around her mouth and jaw which is a known side effect of topiramate.  She stopped this medication about 1 week ago and these symptoms have resolved.  She has previously been on injectable CGRP antagonist called Ajovy and followed with neurology but this was over a year ago.  She has not tried oral CGRP antagonist.  She continues with abortive rizatriptan.  She does feel like Ajovy worked for her and topiramate was also working.  Migraines off preventative medication occur 14-16 days a month.  She does feel like she is about to get a migraine after being off topiramate for a few days. - Start Nurtec 75 mg daily and continue rizatriptan 10 mg as needed

## 2023-09-14 NOTE — Patient Instructions (Addendum)
  Thank you, [name], for allowing [place] to provide your care today.  I am sorry that you had the side effects from the topiramate.  We are going to try a medication called Nurtec which is similar to Ajovy.  It is a once a day oral medication that you dissolve under your tongue to help prevent migraines.  If this is not working we would like you to call our office and I think it would be useful to see your neurologist again especially if Ajovy is needed.  I would like you to resume taking your losartan and rosuvastatin since the numbness and tingling is a side effect of topiramate.  Your medication list will be on the back of this packet but you will stop taking the amlodipine and replace it with losartan.  Please also continue to take Trulicity 0.75 mg weekly for the next 4 weeks and then increase to 1.5 mg weekly.  If you have side effects on this dose please give [place] a call and we can send a new prescription and with the 0.75 mg dose.  We will see you in about 2 or 3 months to recheck your A1c.  If you need [place] for anything before then please do not hesitate to call.  The medications that he will pick up from the pharmacy are Trulicity and Nurtec.  They may have already filled your amlodipine and Jardiance but should not pick these up.  They might have refills of your rosuvastatin, losartan, and rizatriptan which you can pick up if you would like.   Follow up: 2-3 months    Remember:     Should you have any questions or concerns please call the internal medicine clinic at [phone].     [name], DO [facility]

## 2023-09-14 NOTE — Progress Notes (Signed)
CC: Routine Follow Up   HPI:  Wanda [name] is a 52 y.o. female with pertinent PMH of HTN, T2DM, obesity, migraines, and sleep apnea who presents to the clinic for follow-up visit after being seen 08/10/2023. Please see assessment and plan below for further details.  Past Medical History:  Diagnosis Date   HYPERGLYCEMIA 11/18/2010   Qualifier: Diagnosis of   By: Floydene Flock         Hypertension    Migraine    Obesity    Sleep apnea    has a c-pap    Current Outpatient Medications  Medication Instructions   Accu-Chek Softclix Lancets lancets Use as directed to test blood sugar for symptoms of hypoglycemia.   albuterol (VENTOLIN HFA) 108 (90 Base) MCG/ACT inhaler TAKE 2 PUFFS BY MOUTH EVERY 4 HOURS AS NEEDED   Blood Glucose Monitoring Suppl (BLOOD GLUCOSE MONITOR SYSTEM) w/Device KIT Use as directed to test blood sugar 3 times a day (morning, noon, and bedtime).   Glucose Blood (BLOOD GLUCOSE TEST STRIPS) STRP Use to check blood sugar daily as needed for symptoms of hypoglycemia.   hydrochlorothiazide (HYDRODIURIL) 25 mg, Oral, Daily   Insulin Pen Needle 32G X 4 MM MISC use once a week   losartan (COZAAR) 50 mg, Oral, Daily   Nurtec 75 mg, Oral, Daily   rizatriptan (MAXALT) 10 mg, Oral, Once PRN, May repeat in 2 hours if needed   rosuvastatin (CRESTOR) 20 mg, Oral, Daily   Trulicity 0.75 mg, Subcutaneous, Weekly   [START ON 10/12/2023] Trulicity 1.5 mg, Subcutaneous, Weekly     Review of Systems:   Pertinent items noted in HPI and/or A&P.  Physical Exam:  Vitals:   09/14/23 1602  BP: 122/79  Pulse: 81  Temp: 98.2 F (36.8 C)  TempSrc: Oral  SpO2: 100%  Weight: 267 lb 8 oz (121.3 kg)  Height: 5\' 3"  (1.6 m)    Constitutional: Obese and well-appearing adult female. In no acute distress. Cardio:Regular rate and rhythm. 2+ bilateral radial pulses. Pulm:Clear to auscultation bilaterally. Normal work of breathing on room air. YTK:ZSWFUXNA for extremity  edema. Skin:Warm and dry. Neuro:Alert and oriented x3. No focal deficit noted. Psych:Pleasant mood and affect.   Assessment & Plan:   Migraine without aura At her last visit she was started on topiramate for migraine prevention but unfortunately had paresthesias around her mouth and jaw which is a known side effect of topiramate.  She stopped this medication about 1 week ago and these symptoms have resolved.  She has previously been on injectable CGRP antagonist called Ajovy and followed with neurology but this was over a year ago.  She has not tried oral CGRP antagonist.  She continues with abortive rizatriptan.  She does feel like Ajovy worked for her and topiramate was also working.  Migraines off preventative medication occur 14-16 days a month.  She does feel like she is about to get a migraine after being off topiramate for a few days. - Start Nurtec 75 mg daily and continue rizatriptan 10 mg as needed  Essential hypertension Blood pressure today is well-controlled at 122/79.  She was started on losartan 50 mg daily at her last appointment but due to the side effects that she had from her topiramate she stopped this medication about 2 weeks ago and resumed her amlodipine 5 mg daily in addition to continuing hydrochlorothiazide 25 mg daily.  We discussed the renal protective benefits of losartan or amlodipine and she will make the switch  back. - Continue hydrochlorothiazide 25 mg daily and losartan 50 mg daily  Diabetes mellitus, type II (HCC) A1c was 6.9 on 08/10/2023.  At that visit she was started on Trulicity 0.75 mg weekly which she initially had severe nausea and vomiting after taking the first dose.  However she tried another dose of 0.75 mg a week later and she has not had any side effects on that dose or the third and fourth dose.  She is happy to continue with this medication and we discussed increasing the dose in about 4 weeks and she is understanding of the plan. - Continue  Trulicity 0.75 mg weekly for 4 weeks and increase to 1.5 mg weekly after this - Return in 2-3 months for repeat A1c  Hyperlipidemia associated with type 2 diabetes mellitus (HCC) Due to the side effects she had after starting topiramate at the same time she started the rosuvastatin she has not taken this medication in 2 weeks.  We will hold off on repeating her lipid panel at this time and she will resume her rosuvastatin. - Resume rosuvastatin 20 mg daily and repeat lipid panel at next visit    Patient discussed with Dr. Julieanne Cotton, DO [facility] Internal Medicine Resident PGY-2 Clinic Phone: [phone] Pager: [phone]

## 2023-09-14 NOTE — Assessment & Plan Note (Signed)
A1c was 6.9 on 08/10/2023.  At that visit she was started on Trulicity 0.75 mg weekly which she initially had severe nausea and vomiting after taking the first dose.  However she tried another dose of 0.75 mg a week later and she has not had any side effects on that dose or the third and fourth dose.  She is happy to continue with this medication and we discussed increasing the dose in about 4 weeks and she is understanding of the plan. - Continue Trulicity 0.75 mg weekly for 4 weeks and increase to 1.5 mg weekly after this - Return in 2-3 months for repeat A1c

## 2023-09-14 NOTE — Assessment & Plan Note (Signed)
Blood pressure today is well-controlled at 122/79.  She was started on losartan 50 mg daily at her last appointment but due to the side effects that she had from her topiramate she stopped this medication about 2 weeks ago and resumed her amlodipine 5 mg daily in addition to continuing hydrochlorothiazide 25 mg daily.  We discussed the renal protective benefits of losartan or amlodipine and she will make the switch back. - Continue hydrochlorothiazide 25 mg daily and losartan 50 mg daily

## 2023-09-15 ENCOUNTER — Telehealth: Payer: Self-pay

## 2023-09-15 NOTE — Telephone Encounter (Signed)
Incoming fax from pharmacy Medication:Nurtec Message to prescriber:  "Migraine prophylaxis dose is 1 tablet every other day. Insurance wont pay for 30 tablets/30 days. Instru is for prophylaxis or prn use? Please clarify. Thanks!"

## 2023-09-15 NOTE — Progress Notes (Signed)
Internal Medicine Clinic Attending  Case discussed with the resident physician at the time of the visit.  We reviewed the patient's history, exam, and pertinent patient test results.  I agree with the assessment, diagnosis, and plan of care documented in the resident's note.

## 2023-09-16 ENCOUNTER — Other Ambulatory Visit (HOSPITAL_COMMUNITY): Payer: Self-pay

## 2023-09-18 ENCOUNTER — Telehealth: Payer: Self-pay

## 2023-09-18 NOTE — Telephone Encounter (Signed)
Prior Authorization for patient (Nurtec) came through on cover my meds was submitted with last office notes awaiting approval or denial.  UEA:[id number]

## 2023-09-21 NOTE — Telephone Encounter (Signed)
Decision:Approved  Omnia Early Chars (KeyHarle Battiest) PA Case ID #: [id number] Need Help? Call us at [phone] Outcome Approved on October 18 by [employer] NCPDP 2017 Your PA request has been approved. Additional information will be provided in the approval communication. (Message 1145) Authorization Expiration Date: 09/16/2024 Drug Nurtec 75MG  dispersible tablets ePA cloud Psychologist, educational Electronic PA Form [phone] NCPDP)

## 2023-09-22 ENCOUNTER — Ambulatory Visit: Payer: BC Managed Care – PPO | Admitting: Nurse Practitioner

## 2023-09-24 ENCOUNTER — Other Ambulatory Visit (HOSPITAL_COMMUNITY): Payer: Self-pay

## 2023-10-05 ENCOUNTER — Other Ambulatory Visit (HOSPITAL_COMMUNITY): Payer: Self-pay

## 2023-10-05 ENCOUNTER — Encounter (HOSPITAL_COMMUNITY): Payer: Self-pay

## 2023-10-07 ENCOUNTER — Other Ambulatory Visit (HOSPITAL_COMMUNITY): Payer: Self-pay

## 2023-10-09 ENCOUNTER — Other Ambulatory Visit: Payer: Self-pay

## 2023-10-09 ENCOUNTER — Other Ambulatory Visit (HOSPITAL_COMMUNITY): Payer: Self-pay

## 2023-11-01 ENCOUNTER — Other Ambulatory Visit (HOSPITAL_COMMUNITY): Payer: Self-pay

## 2023-11-27 ENCOUNTER — Encounter: Payer: Self-pay | Admitting: Student

## 2023-12-04 ENCOUNTER — Other Ambulatory Visit (HOSPITAL_COMMUNITY): Payer: Self-pay

## 2023-12-04 ENCOUNTER — Other Ambulatory Visit: Payer: Self-pay | Admitting: Student

## 2023-12-04 ENCOUNTER — Telehealth: Payer: Self-pay

## 2023-12-04 ENCOUNTER — Encounter (HOSPITAL_COMMUNITY): Payer: Self-pay

## 2023-12-04 ENCOUNTER — Other Ambulatory Visit: Payer: Self-pay | Admitting: Internal Medicine

## 2023-12-04 ENCOUNTER — Ambulatory Visit: Payer: Self-pay | Admitting: Student

## 2023-12-04 DIAGNOSIS — E1169 Type 2 diabetes mellitus with other specified complication: Secondary | ICD-10-CM

## 2023-12-04 DIAGNOSIS — R0981 Nasal congestion: Secondary | ICD-10-CM

## 2023-12-04 MED ORDER — OXYMETAZOLINE HCL 0.05 % NA SOLN
1.0000 | Freq: Two times a day (BID) | NASAL | 0 refills | Status: AC
Start: 2023-12-04 — End: 2024-01-03
  Filled 2023-12-04: qty 30, 30d supply, fill #0

## 2023-12-04 MED ORDER — SEMAGLUTIDE(0.25 OR 0.5MG/DOS) 2 MG/3ML ~~LOC~~ SOPN
0.2500 mg | PEN_INJECTOR | SUBCUTANEOUS | 1 refills | Status: DC
Start: 2023-12-04 — End: 2024-01-27
  Filled 2023-12-04: qty 3, 56d supply, fill #0
  Filled 2024-01-05: qty 3, 28d supply, fill #1

## 2023-12-04 MED ORDER — FLUTICASONE PROPIONATE 50 MCG/ACT NA SUSP
2.0000 | Freq: Every day | NASAL | 2 refills | Status: AC
  Filled 2023-12-04: qty 16, 30d supply, fill #0

## 2023-12-04 MED ORDER — TRULICITY 1.5 MG/0.5ML ~~LOC~~ SOAJ
1.5000 mg | SUBCUTANEOUS | 1 refills | Status: DC
  Filled 2023-12-04: qty 2, 28d supply, fill #0

## 2023-12-04 NOTE — Telephone Encounter (Signed)
 RTC to patient states she has a lot of congestion.  Has to sleep with mouth open due to the congestion.  No fevers .  Has had the congestion for 4 days.  Requesting a TeleHealth appointment .  States whe asked that she is blowing out lime green mucous from her nose.  Has been using SudaFed PE and Mucinex  DM with no relief.

## 2023-12-04 NOTE — Assessment & Plan Note (Addendum)
 Reports nasal congestion and drainage for past 4-5 days. Recent sick contact include her grandson on 12/29. Denies cough, sore throat, fever, n/v, SOB. Has humidifier and neti pot at home. Tried Sudafed and Mucinex  with mild relief. Congestion bothersome due to difficulty sleeping and needing to mouth breathe. Suspect viral sinusitis. Add on Afrin for 3 days along with steroid nasal spray for symptom mgmt. Return precautions given if symptoms worsen or persists. She verbalizes understanding of plan.   Plan -Continue using humidifier and saline irrigation -Flonase  spray daily -Afrin spray BID x 3 days -Tylenol  or ibuprofen  PRN  -Return precautions given

## 2023-12-04 NOTE — Telephone Encounter (Signed)
 Requesting an appt for today. States she feels very congested and body ache, not feeling well for 4 days now.

## 2023-12-04 NOTE — Progress Notes (Signed)
   CC: sinus congestion  This is a telephone encounter between Wanda [name] and Wanda [name] on 12/04/2023 for sinus congestion. The visit was conducted with the patient located at home and Wanda [name] at [facility]. The patient's identity was confirmed using their DOB and current address. The patient has consented to being evaluated through a telephone encounter and understands the associated risks (an examination cannot be done and the patient may need to come in for an appointment) / benefits (allows the patient to remain at home, decreasing exposure to coronavirus). I personally spent 8 minutes on medical discussion.   HPI:  Wanda [name] is a 53 y.o. with PMH as below.   Please see A&P for assessment of the patient's acute and chronic medical conditions.   Past Medical History:  Diagnosis Date   HYPERGLYCEMIA 11/18/2010   Qualifier: Diagnosis of   By: Josetta Corrigan         Hypertension    Migraine    Obesity    Sleep apnea    has a c-pap   Review of Systems:   Yes: sinus congestion  No: cough, SOB, nausea, vomiting, fever   Assessment & Plan:   Nasal congestion Reports nasal congestion and drainage for past 4-5 days. Recent sick contact include her grandson on 12/29. Denies cough, sore throat, fever, n/v, SOB. Has humidifier and neti pot at home. Tried Sudafed and Mucinex  with mild relief. Congestion bothersome due to difficulty sleeping and needing to mouth breathe. Suspect viral sinusitis. Add on Afrin for 3 days along with steroid nasal spray for symptom mgmt. Return precautions given if symptoms worsen or persists. She verbalizes understanding of plan.   Plan -Continue using humidifier and saline irrigation -Flonase  spray daily -Afrin spray BID x 3 days -Tylenol  or ibuprofen  PRN  -Return precautions given    Patient discussed with Dr. Karna Ozell Nearing, DO Internal Medicine Resident 12/04/23

## 2023-12-14 NOTE — Progress Notes (Signed)
Internal Medicine Clinic Attending  Case discussed with Dr. Zheng  At the time of the visit.  We reviewed the resident's history and exam and pertinent patient test results.  I agree with the assessment, diagnosis, and plan of care documented in the resident's note.  

## 2024-01-05 ENCOUNTER — Other Ambulatory Visit (HOSPITAL_COMMUNITY): Payer: Self-pay

## 2024-01-05 MED ORDER — OSELTAMIVIR PHOSPHATE 75 MG PO CAPS
75.0000 mg | ORAL_CAPSULE | Freq: Two times a day (BID) | ORAL | 0 refills | Status: DC
  Filled 2024-01-05: qty 10, 5d supply, fill #0

## 2024-01-05 MED ORDER — BENZONATATE 100 MG PO CAPS
100.0000 mg | ORAL_CAPSULE | Freq: Three times a day (TID) | ORAL | 0 refills | Status: DC | PRN
  Filled 2024-01-05: qty 20, 7d supply, fill #0

## 2024-01-05 MED ORDER — PROMETHAZINE-DM 6.25-15 MG/5ML PO SYRP
5.0000 mL | ORAL_SOLUTION | Freq: Four times a day (QID) | ORAL | 0 refills | Status: DC | PRN
  Filled 2024-01-05: qty 180, 5d supply, fill #0

## 2024-01-05 NOTE — Progress Notes (Signed)
PUO

## 2024-01-05 NOTE — Progress Notes (Signed)
 Subjective Patient ID: Wanda [name] is a 53 y.o. female.  Chief Complaint  Patient presents with  . Generalized Body Aches    Patient states that last night around 7pm, she developed a fever, body aches and congestion. Patient took Mucinex  and Tylenol  with minimal relief.   . Fever  . Nasal Congestion    The following information was reviewed by members of the visit team:  Tobacco  Allergies  Meds  Problems  Med Hx  Surg Hx  OB Status   Fam Hx      HPI Symptoms started: 12 hours ago Symptoms include:  Acute onset of body aches, headache, fever, TMAX 103.6, congestion Pertinent negatives: Denies shortness of breath Worsened by: nothing Relieved by: tylenol  and mucinex   Has there been any known close exposure to anyone who has been ill? Yes Took care  of grandchildren over prior 2 days who have all tested positive for Influenza  Past medical, surgical, family, and social histories, as well as medications and allergies, reviewed as below.   Past Medical History:  Diagnosis Date  . Hypertension     No family history on file.  Past Surgical History:  Procedure Laterality Date  . CESAREAN SECTION    . CHOLECYSTECTOMY    . ENDOMETRIAL ABLATION W/ NOVASURE      Current Outpatient Medications on File Prior to Visit  Medication Sig Dispense Refill  . albuterol HFA (PROVENTIL HFA;VENTOLIN HFA;PROAIR HFA) 90 mcg/actuation inhaler TAKE 2 PUFFS BY MOUTH EVERY 4 HOURS AS NEEDED    . fluticasone  propionate (FLONASE ) 50 mcg/spray nasal spray Administer 2 sprays into affected nostril(s).    . hydroCHLOROthiazide  (HYDRODIURIL ) 25 mg tablet Take 25 mg by mouth daily as needed. for swelling    . levocetirizine (XYZAL ) 5 mg tablet Take 1 tablet by mouth daily.    . linaCLOtide  (Linzess ) 290 mcg cap capsule Take 1 capsule by mouth daily.    . losartan  (COZAAR ) 50 mg tablet Take 50 mg by mouth daily.    . lubiprostone  (AMITIZA ) 8 mcg capsule Take 1 capsule by mouth.    .  rosuvastatin  (CRESTOR ) 20 mg tablet Take 20 mg by mouth daily.    . semaglutide  (OZEMPIC ) 0.25 mg or 0.5 mg (2 mg/3 mL) subcutaneous pen injector Inject 0.25 mg under the skin.    . amLODIPine  (NORVASC ) 5 mg tablet Take 1 tablet by mouth daily. (Patient not taking: Reported on 01/05/2024)    . [DISCONTINUED] promethazine -dextromethorphan (PHENERGAN  DM) 6.25-15 mg/5 mL syrp syrup Take 5 mL by mouth every 4 (four) hours. (Patient not taking: Reported on 01/05/2024) 180 mL 0   No current facility-administered medications on file prior to visit.    Allergies  Allergen Reactions  . Sumatriptan  Other (See Comments)    felt bad, had chest pressure    Social History   Tobacco Use  . Smoking status: Never  . Smokeless tobacco: Never  Vaping Use  . Vaping status: Never Used  Substance Use Topics  . Alcohol use: Never  . Drug use: Never      Vitals:   01/05/24 0802  BP: 109/77  Pulse: 85  Resp: 18  Temp: 97.2 F (36.2 C)  SpO2: 100%    Review of Systems  Objective Physical Exam Vitals reviewed.  Constitutional:      Appearance: Normal appearance.  HENT:     Head: Normocephalic and atraumatic.     Right Ear: Tympanic membrane, ear canal and external ear normal.  Left Ear: Tympanic membrane, ear canal and external ear normal.     Nose: Congestion and rhinorrhea present.     Mouth/Throat:     Mouth: Mucous membranes are moist.     Pharynx: Oropharynx is clear. No posterior oropharyngeal erythema.  Eyes:     Extraocular Movements: Extraocular movements intact.  Cardiovascular:     Rate and Rhythm: Normal rate and regular rhythm.     Pulses: Normal pulses.     Heart sounds: Normal heart sounds.  Pulmonary:     Effort: Pulmonary effort is normal.     Breath sounds: Normal breath sounds.  Musculoskeletal:        General: Normal range of motion.     Cervical back: Normal range of motion and neck supple.  Lymphadenopathy:     Cervical: No cervical adenopathy.  Skin:     General: Skin is warm and dry.  Neurological:     General: No focal deficit present.     Mental Status: She is alert and oriented to person, place, and time.     Assessment/Plan See below  [name] is a 53 y.o. female is here for fever, nasal congestion, cough and body aches starting about 12 hours ago after recent exposure to confirmed influenza.  POC COVID and flu negative here today Afebrile and overall well appearing. Lungs CTAB. O2 sat 100% on RA indicating excellent oxygenation. No evidence to suggest pneumonia. Exam reveals no evidence of strep, OM, bacterial sinusitis, bronchitis. Overall clinical picture most c/w viral URI, likely early influenza given exposure to several grandchildren with same. Patient requesting tamiflu  and given exposure and overall presentation, suspect flu test false negative as obtained early in her illness. Will treat with tamiflu , tessalon  and cough syrup as below.  Recommend supportive treatments with nasal saline PRN nasal congestion,  tylenol /motrin  for aches/pain, aggressive hydration and rest. Current CDC isolation recommendations reviewed and provided to the patient in writing at time of discharge.  Patient will return for new or worsening symptoms. I discussed the findings today, diagnosis/differential diagnosis, plan and red flags that require return for reevaluation with PCP, Urgent care or EMERGENCY. Patient was agreeable to outlined plan and questions were answered, felt stable for discharge.      1. Nonspecific syndrome suggestive of viral illness      2. Fever, unspecified fever cause  POC SARS-COV-2 SYMPTOMATIC (IDNOW)   POC Influenza A&B NAT (IDNOW)    3. Nasal congestion  POC SARS-COV-2 SYMPTOMATIC (IDNOW)   POC Influenza A&B NAT (IDNOW)    4. Exposure to influenza        All pertinent previous records reviewed prior to and during visit.  Urgent Care Disposition:  Home Care   Requested Prescriptions   Signed Prescriptions  Disp Refills  . oseltamivir  (TAMIFLU ) 75 mg capsule 10 capsule 0    Sig: Take 1 capsule (75 mg total) by mouth 2 (two) times a day for 5 days.  . promethazine -dextromethorphan (PHENERGAN  DM) 6.25-15 mg/5 mL syrp syrup 180 mL 0    Sig: 1-2 teaspoons every 6 hours as needed for cough mainly at bedtime.  Do not take and drive.  . benzonatate  (TESSALON ) 100 mg capsule 20 capsule 0    Sig: Take 1 capsule (100 mg total) by mouth 3 (three) times a day as needed for cough.    Orders Placed This Encounter  Procedures  . POC SARS-COV-2 SYMPTOMATIC (IDNOW)    Is the patient symptomatic as defined by CDC?:  Yes    Is the patient in ICU?:   No    Is the patient hospitalized?:   No  . POC Influenza A&B NAT (IDNOW)            Electronically signed: Jon Earnie Flank, NP 01/05/2024  8:38 AM

## 2024-01-06 ENCOUNTER — Other Ambulatory Visit (HOSPITAL_COMMUNITY): Payer: Self-pay

## 2024-01-19 ENCOUNTER — Other Ambulatory Visit: Payer: Self-pay

## 2024-01-19 ENCOUNTER — Other Ambulatory Visit (HOSPITAL_COMMUNITY): Payer: Self-pay

## 2024-01-27 ENCOUNTER — Other Ambulatory Visit: Payer: Self-pay | Admitting: Internal Medicine

## 2024-01-27 DIAGNOSIS — E1169 Type 2 diabetes mellitus with other specified complication: Secondary | ICD-10-CM

## 2024-01-28 ENCOUNTER — Other Ambulatory Visit (HOSPITAL_COMMUNITY): Payer: Self-pay

## 2024-01-28 MED ORDER — OZEMPIC (0.25 OR 0.5 MG/DOSE) 2 MG/3ML ~~LOC~~ SOPN
0.2500 mg | PEN_INJECTOR | SUBCUTANEOUS | 1 refills | Status: DC
Start: 2024-01-28 — End: 2024-03-03
  Filled 2024-01-28 – 2024-02-01 (×2): qty 3, 28d supply, fill #0

## 2024-02-01 ENCOUNTER — Other Ambulatory Visit (HOSPITAL_COMMUNITY): Payer: Self-pay

## 2024-02-16 ENCOUNTER — Other Ambulatory Visit: Payer: Self-pay

## 2024-02-16 ENCOUNTER — Other Ambulatory Visit (HOSPITAL_COMMUNITY): Payer: Self-pay

## 2024-03-03 ENCOUNTER — Encounter (HOSPITAL_COMMUNITY): Payer: Self-pay

## 2024-03-03 ENCOUNTER — Ambulatory Visit: Admitting: Student

## 2024-03-03 ENCOUNTER — Other Ambulatory Visit (HOSPITAL_COMMUNITY): Payer: Self-pay

## 2024-03-03 ENCOUNTER — Encounter: Payer: Self-pay | Admitting: Student

## 2024-03-03 VITALS — BP 116/79 | HR 88 | Temp 98.6°F | Ht 63.0 in | Wt 268.3 lb

## 2024-03-03 DIAGNOSIS — E1169 Type 2 diabetes mellitus with other specified complication: Secondary | ICD-10-CM

## 2024-03-03 DIAGNOSIS — K5909 Other constipation: Secondary | ICD-10-CM | POA: Diagnosis not present

## 2024-03-03 DIAGNOSIS — Z6841 Body Mass Index (BMI) 40.0 and over, adult: Secondary | ICD-10-CM

## 2024-03-03 DIAGNOSIS — E785 Hyperlipidemia, unspecified: Secondary | ICD-10-CM | POA: Diagnosis not present

## 2024-03-03 DIAGNOSIS — I10 Essential (primary) hypertension: Secondary | ICD-10-CM | POA: Diagnosis not present

## 2024-03-03 DIAGNOSIS — G4730 Sleep apnea, unspecified: Secondary | ICD-10-CM

## 2024-03-03 DIAGNOSIS — J302 Other seasonal allergic rhinitis: Secondary | ICD-10-CM

## 2024-03-03 DIAGNOSIS — Z7985 Long-term (current) use of injectable non-insulin antidiabetic drugs: Secondary | ICD-10-CM

## 2024-03-03 DIAGNOSIS — E66813 Obesity, class 3: Secondary | ICD-10-CM

## 2024-03-03 LAB — POCT GLYCOSYLATED HEMOGLOBIN (HGB A1C): Hemoglobin A1C: 5.9 % — AB (ref 4.0–5.6)

## 2024-03-03 LAB — GLUCOSE, CAPILLARY: Glucose-Capillary: 99 mg/dL (ref 70–99)

## 2024-03-03 MED ORDER — TIRZEPATIDE 2.5 MG/0.5ML ~~LOC~~ SOAJ
2.5000 mg | SUBCUTANEOUS | 11 refills | Status: DC
  Filled 2024-03-03: qty 2, 28d supply, fill #0
  Filled 2024-04-01: qty 2, 28d supply, fill #1

## 2024-03-03 MED ORDER — LEVOCETIRIZINE DIHYDROCHLORIDE 5 MG PO TABS
5.0000 mg | ORAL_TABLET | Freq: Every evening | ORAL | 11 refills | Status: AC
  Filled 2024-03-03 (×2): qty 30, 30d supply, fill #0
  Filled 2024-10-29: qty 30, 30d supply, fill #1
  Filled 2024-12-02: qty 30, 30d supply, fill #2
  Filled 2025-01-05: qty 30, 30d supply, fill #3

## 2024-03-03 NOTE — Assessment & Plan Note (Signed)
 Unable to tolerate dulaglutide and semaglutide due to GI upset. Will try tirzepatide. A1c under good control now having been on semaglutide for several months. - tirzepatide 2.5mg  daily - ACR

## 2024-03-03 NOTE — Assessment & Plan Note (Addendum)
 Good control today. 116/79. No side effects of high or low pressures. Continue current therapy: - Hydrochlorothiazide 25 daily - losartan 50 daily

## 2024-03-03 NOTE — Patient Instructions (Addendum)
 Start the new medicine - Tirzepatide  Expect a phone call to establish with the weight loss clinic  Consider calling your GI to talk about constipation. Continue Linzess and Amitiza and take psyllium daily.

## 2024-03-03 NOTE — Assessment & Plan Note (Signed)
 Primary prevention of CV disease.  - Continus rosuvastatin 20 daily - Lipid panel today

## 2024-03-03 NOTE — Progress Notes (Unsigned)
   CC: Diabetes checkup and med refill  HPI:  [name] is a 53 y.o. female with a PMH stated below who presents today for general checkup. She has no acute complaints.  Please see problem based assessment and plan for additional details.  Past Medical History:  Diagnosis Date   HYPERGLYCEMIA 11/18/2010   Qualifier: Diagnosis of   By: Wanda [name]         Hypertension    Migraine    Obesity    Sleep apnea    has a c-pap    Review of Systems: ROS negative except for what is noted on the assessment and plan.  Vitals:   03/03/24 1511  BP: 116/79  Pulse: 88  Temp: 98.6 F (37 C)  TempSrc: Oral  SpO2: 100%  Weight: 268 lb 4.8 oz (121.7 kg)  Height: 5\' 3"  (1.6 m)    Physical Exam: Constitutional: well-appearing woman in no acute distress HENT: normocephalic atraumatic, mucous membranes moist Eyes: conjunctiva non-erythematous Cardiovascular: regular rate and rhythm, no m/r/g Pulmonary/Chest: normal work of breathing on room air, lungs clear to auscultation bilaterally Abdominal: soft, non-tender, non-distended MSK: normal bulk and tone Neurological: alert & oriented x 3, no focal deficit Skin: warm and dry Psych: normal mood and behavior   Assessment & Plan:   Patient discussed with Dr. Sol [name]  Essential hypertension Good control today. 116/79. No side effects of high or low pressures. Continue current therapy: - Hydrochlorothiazide 25 daily - losartan 50 daily  Diabetes mellitus, type II (HCC) Unable to tolerate dulaglutide and semaglutide due to GI upset. Will try tirzepatide. A1c under good control now having been on semaglutide for several months. - tirzepatide 2.5mg  daily - ACR  Hyperlipidemia associated with type 2 diabetes mellitus (HCC) Primary prevention of CV disease.  - Continus rosuvastatin 20 daily - Lipid panel today  Class 3 severe obesity with serious comorbidity and body mass index (BMI) of 45.0 to 49.9 in adult [facility]) She has  worked hard at weight loss with limited success. She tracked calories both before and after starting Trulicity/Ozempic and reports intake average of 1200 calories daily. She has seen a dietician. She eats small portions and exercises by walking multiple times weekly. Unfortunately weight remains unchanged. - tirzepatide per above - Ref to weight loss clinic   Chronic constipation Ongoing issue for which she saw GI in the past. Unfortunately that workup stalled when she could not get approval for a procedure. Since then, she is taking linzess and amitiza  RTC in 3 months diabetes checkup  Wanda [name], D.O. [facility] Internal Medicine, PGY-1 Phone: [phone] Date 03/04/2024 Time 12:00 PM

## 2024-03-04 ENCOUNTER — Encounter: Payer: Self-pay | Admitting: Student

## 2024-03-04 DIAGNOSIS — K5909 Other constipation: Secondary | ICD-10-CM | POA: Insufficient documentation

## 2024-03-04 LAB — MICROALBUMIN / CREATININE URINE RATIO
Creatinine, Urine: 393.9 mg/dL
Microalb/Creat Ratio: 10 mg/g{creat} (ref 0–29)
Microalbumin, Urine: 37.6 ug/mL

## 2024-03-04 LAB — LIPID PANEL
Chol/HDL Ratio: 2.1 ratio (ref 0.0–4.4)
Cholesterol, Total: 103 mg/dL (ref 100–199)
HDL: 48 mg/dL (ref >39)
LDL Chol Calc (NIH): 42 mg/dL (ref 0–99)
Triglycerides: 59 mg/dL (ref 0–149)
VLDL Cholesterol Cal: 13 mg/dL (ref 5–40)

## 2024-03-04 NOTE — Assessment & Plan Note (Signed)
 Ongoing issue for which she saw GI in the past. Unfortunately that workup stalled when she could not get approval for a procedure. Since then, she is taking linzess and [place]

## 2024-03-04 NOTE — Progress Notes (Signed)
 Internal Medicine Clinic Attending  Case discussed with the resident at the time of the visit.  We reviewed the resident's history and exam and pertinent patient test results.  I agree with the assessment, diagnosis, and plan of care documented in the resident's note.

## 2024-03-04 NOTE — Assessment & Plan Note (Addendum)
 She has worked hard at [employer] loss with limited success. She tracked calories both before and after starting Trulicity/Ozempic and reports intake average of 1200 calories daily. She has seen a dietician. She eats small portions and exercises by walking multiple times weekly. Unfortunately weight remains unchanged. - tirzepatide per above - Ref to weight loss clinic

## 2024-03-07 ENCOUNTER — Encounter (INDEPENDENT_AMBULATORY_CARE_PROVIDER_SITE_OTHER): Payer: Self-pay

## 2024-03-14 ENCOUNTER — Other Ambulatory Visit: Payer: Self-pay | Admitting: Internal Medicine

## 2024-03-14 DIAGNOSIS — E1169 Type 2 diabetes mellitus with other specified complication: Secondary | ICD-10-CM

## 2024-03-15 ENCOUNTER — Other Ambulatory Visit (HOSPITAL_BASED_OUTPATIENT_CLINIC_OR_DEPARTMENT_OTHER): Payer: Self-pay

## 2024-03-15 MED ORDER — BLOOD GLUCOSE MONITOR SYSTEM W/DEVICE KIT
1.0000 | PACK | Freq: Three times a day (TID) | 0 refills | Status: AC
  Filled 2024-03-15 – 2024-05-02 (×2): qty 1, 1d supply, fill #0

## 2024-03-17 ENCOUNTER — Other Ambulatory Visit (HOSPITAL_COMMUNITY): Payer: Self-pay

## 2024-03-17 ENCOUNTER — Other Ambulatory Visit: Payer: Self-pay

## 2024-04-11 ENCOUNTER — Encounter: Payer: Self-pay | Admitting: Student

## 2024-04-12 ENCOUNTER — Other Ambulatory Visit: Payer: Self-pay | Admitting: Internal Medicine

## 2024-04-12 ENCOUNTER — Telehealth: Payer: Self-pay | Admitting: *Deleted

## 2024-04-12 NOTE — Telephone Encounter (Signed)
 Call to patient to ask when she last had the lubipostone filled. Stated over a year ago by another physician at her former doctor's office.  States is having problems with her bowel movements. Stats also would like to get an increase dose on her Mounjaro  as she has been on the same dose for about 2 months.

## 2024-04-19 ENCOUNTER — Other Ambulatory Visit: Payer: Self-pay

## 2024-04-19 ENCOUNTER — Other Ambulatory Visit (HOSPITAL_COMMUNITY): Payer: Self-pay

## 2024-04-19 ENCOUNTER — Other Ambulatory Visit: Payer: Self-pay | Admitting: Internal Medicine

## 2024-04-19 DIAGNOSIS — K5909 Other constipation: Secondary | ICD-10-CM

## 2024-04-19 DIAGNOSIS — E1169 Type 2 diabetes mellitus with other specified complication: Secondary | ICD-10-CM

## 2024-04-19 MED ORDER — MOUNJARO 5 MG/0.5ML ~~LOC~~ SOAJ
5.0000 mg | SUBCUTANEOUS | 1 refills | Status: DC
  Filled 2024-04-19 – 2024-05-02 (×2): qty 2, 28d supply, fill #0
  Filled 2024-05-25 – 2024-05-31 (×2): qty 2, 28d supply, fill #1

## 2024-04-19 MED ORDER — LUBIPROSTONE 8 MCG PO CAPS
8.0000 ug | ORAL_CAPSULE | Freq: Two times a day (BID) | ORAL | 0 refills | Status: DC
  Filled 2024-04-19 – 2024-04-20 (×2): qty 60, 30d supply, fill #0

## 2024-04-19 NOTE — Progress Notes (Signed)
 Pt reports tolerating Mounjaro  well and request a dose increase. She appears to have tolerated 2.5 mg dose for >1 month well. Will increase to 5 mg weekly and pt is to follow up prior to further increases.

## 2024-04-19 NOTE — Telephone Encounter (Signed)
 RTC to patient message left that Dr. Meredeth Stallion will increase her Mounjaro .  Patient was also asked to call for an appointment to see Dr. Meredeth Stallion this month.

## 2024-04-20 ENCOUNTER — Telehealth: Payer: Self-pay

## 2024-04-20 ENCOUNTER — Other Ambulatory Visit: Payer: Self-pay

## 2024-04-20 ENCOUNTER — Other Ambulatory Visit (HOSPITAL_COMMUNITY): Payer: Self-pay

## 2024-04-20 ENCOUNTER — Encounter (HOSPITAL_COMMUNITY): Payer: Self-pay

## 2024-04-20 NOTE — Telephone Encounter (Signed)
 Luceal Mangus (Key: [id number]) Mounjaro  5MG /0.5ML auto-injectors Form Ambulance person PA Form (2017 NCPDP) Created Sent to Plan Plan Response Submit Clinical Questions Determination Favorable Message from Plan Your PA request has been approved. Additional information will be provided in the approval communication. (Message 1145). Authorization Expiration Date: Apr 21, 2027.

## 2024-04-20 NOTE — Telephone Encounter (Signed)
 Prior Authorization for patient (Mounjaro  5MG /0.5ML auto-injectors) came through on cover my meds was submitted with last office notes and labs awaiting approval or denial.  ZOX:[id number]

## 2024-04-21 ENCOUNTER — Other Ambulatory Visit (HOSPITAL_COMMUNITY): Payer: Self-pay

## 2024-05-02 ENCOUNTER — Other Ambulatory Visit (HOSPITAL_BASED_OUTPATIENT_CLINIC_OR_DEPARTMENT_OTHER): Payer: Self-pay

## 2024-05-02 ENCOUNTER — Other Ambulatory Visit: Payer: Self-pay

## 2024-05-02 ENCOUNTER — Encounter (HOSPITAL_COMMUNITY): Payer: Self-pay

## 2024-05-02 ENCOUNTER — Other Ambulatory Visit (HOSPITAL_COMMUNITY): Payer: Self-pay

## 2024-05-02 ENCOUNTER — Other Ambulatory Visit: Payer: Self-pay | Admitting: Internal Medicine

## 2024-05-02 DIAGNOSIS — E1169 Type 2 diabetes mellitus with other specified complication: Secondary | ICD-10-CM

## 2024-05-02 MED ORDER — MELOXICAM 15 MG PO TABS
15.0000 mg | ORAL_TABLET | Freq: Every day | ORAL | 0 refills | Status: DC
  Filled 2024-05-02 (×2): qty 15, 15d supply, fill #0

## 2024-05-02 MED ORDER — BLOOD GLUCOSE TEST VI STRP
1.0000 | ORAL_STRIP | Freq: Every day | 0 refills | Status: AC | PRN
  Filled 2024-05-02: qty 50, 50d supply, fill #0
  Filled 2024-05-03: qty 100, 100d supply, fill #0
  Filled 2024-05-03: qty 50, 50d supply, fill #0

## 2024-05-02 NOTE — Telephone Encounter (Signed)
 Medication sent to pharmacy

## 2024-05-03 ENCOUNTER — Other Ambulatory Visit (HOSPITAL_COMMUNITY): Payer: Self-pay

## 2024-05-03 ENCOUNTER — Other Ambulatory Visit: Payer: Self-pay

## 2024-05-03 ENCOUNTER — Encounter (HOSPITAL_COMMUNITY): Payer: Self-pay

## 2024-05-03 MED ORDER — MELOXICAM 15 MG PO TABS
15.0000 mg | ORAL_TABLET | Freq: Every day | ORAL | 0 refills | Status: DC
  Filled 2024-05-03 – 2024-05-18 (×2): qty 30, 30d supply, fill #0

## 2024-05-12 ENCOUNTER — Other Ambulatory Visit: Payer: Self-pay | Admitting: Internal Medicine

## 2024-05-12 ENCOUNTER — Other Ambulatory Visit (HOSPITAL_COMMUNITY): Payer: Self-pay

## 2024-05-12 ENCOUNTER — Other Ambulatory Visit: Payer: Self-pay

## 2024-05-12 DIAGNOSIS — K5909 Other constipation: Secondary | ICD-10-CM

## 2024-05-12 MED ORDER — LUBIPROSTONE 8 MCG PO CAPS
8.0000 ug | ORAL_CAPSULE | Freq: Two times a day (BID) | ORAL | 0 refills | Status: AC
  Filled 2024-05-12 – 2024-05-18 (×3): qty 60, 30d supply, fill #0

## 2024-05-13 ENCOUNTER — Other Ambulatory Visit (HOSPITAL_COMMUNITY): Payer: Self-pay

## 2024-05-13 ENCOUNTER — Other Ambulatory Visit: Payer: Self-pay

## 2024-05-16 ENCOUNTER — Encounter: Payer: Self-pay | Admitting: *Deleted

## 2024-05-18 ENCOUNTER — Other Ambulatory Visit (HOSPITAL_COMMUNITY): Payer: Self-pay

## 2024-05-18 ENCOUNTER — Other Ambulatory Visit: Payer: Self-pay

## 2024-05-20 ENCOUNTER — Encounter (HOSPITAL_COMMUNITY): Payer: Self-pay

## 2024-05-20 ENCOUNTER — Other Ambulatory Visit (HOSPITAL_COMMUNITY): Payer: Self-pay

## 2024-05-20 ENCOUNTER — Other Ambulatory Visit: Payer: Self-pay

## 2024-05-20 MED ORDER — VALACYCLOVIR HCL 500 MG PO TABS
500.0000 mg | ORAL_TABLET | Freq: Every day | ORAL | 3 refills | Status: AC
  Filled 2024-05-20 (×2): qty 90, 90d supply, fill #0

## 2024-05-23 ENCOUNTER — Other Ambulatory Visit (HOSPITAL_COMMUNITY): Payer: Self-pay

## 2024-05-23 ENCOUNTER — Other Ambulatory Visit: Payer: Self-pay | Admitting: Obstetrics and Gynecology

## 2024-05-23 DIAGNOSIS — Z9189 Other specified personal risk factors, not elsewhere classified: Secondary | ICD-10-CM

## 2024-05-25 ENCOUNTER — Other Ambulatory Visit: Payer: Self-pay

## 2024-06-01 ENCOUNTER — Other Ambulatory Visit (HOSPITAL_COMMUNITY): Payer: Self-pay

## 2024-06-08 ENCOUNTER — Other Ambulatory Visit (HOSPITAL_COMMUNITY): Payer: Self-pay

## 2024-06-08 ENCOUNTER — Other Ambulatory Visit: Payer: Self-pay

## 2024-06-26 ENCOUNTER — Other Ambulatory Visit: Payer: Self-pay | Admitting: Internal Medicine

## 2024-06-26 DIAGNOSIS — E1169 Type 2 diabetes mellitus with other specified complication: Secondary | ICD-10-CM

## 2024-06-27 ENCOUNTER — Other Ambulatory Visit: Payer: Self-pay

## 2024-06-29 ENCOUNTER — Encounter: Payer: Self-pay | Admitting: Student

## 2024-06-30 ENCOUNTER — Other Ambulatory Visit (HOSPITAL_COMMUNITY): Payer: Self-pay

## 2024-06-30 ENCOUNTER — Other Ambulatory Visit: Payer: Self-pay

## 2024-06-30 MED ORDER — MOUNJARO 5 MG/0.5ML ~~LOC~~ SOAJ
5.0000 mg | SUBCUTANEOUS | 1 refills | Status: DC
  Filled 2024-06-30: qty 2, 28d supply, fill #0

## 2024-07-04 ENCOUNTER — Encounter (HOSPITAL_COMMUNITY): Payer: Self-pay

## 2024-07-04 ENCOUNTER — Ambulatory Visit: Admitting: Student

## 2024-07-04 ENCOUNTER — Other Ambulatory Visit (HOSPITAL_COMMUNITY): Payer: Self-pay

## 2024-07-04 VITALS — BP 121/89 | HR 102 | Temp 98.2°F | Wt 264.4 lb

## 2024-07-04 DIAGNOSIS — E785 Hyperlipidemia, unspecified: Secondary | ICD-10-CM

## 2024-07-04 DIAGNOSIS — M25561 Pain in right knee: Secondary | ICD-10-CM | POA: Diagnosis not present

## 2024-07-04 DIAGNOSIS — E1169 Type 2 diabetes mellitus with other specified complication: Secondary | ICD-10-CM

## 2024-07-04 DIAGNOSIS — Z131 Encounter for screening for diabetes mellitus: Secondary | ICD-10-CM

## 2024-07-04 DIAGNOSIS — I10 Essential (primary) hypertension: Secondary | ICD-10-CM

## 2024-07-04 DIAGNOSIS — E119 Type 2 diabetes mellitus without complications: Secondary | ICD-10-CM | POA: Diagnosis not present

## 2024-07-04 DIAGNOSIS — M25562 Pain in left knee: Secondary | ICD-10-CM

## 2024-07-04 DIAGNOSIS — E66813 Obesity, class 3: Secondary | ICD-10-CM

## 2024-07-04 DIAGNOSIS — G8929 Other chronic pain: Secondary | ICD-10-CM

## 2024-07-04 DIAGNOSIS — G4730 Sleep apnea, unspecified: Secondary | ICD-10-CM

## 2024-07-04 DIAGNOSIS — K5909 Other constipation: Secondary | ICD-10-CM

## 2024-07-04 DIAGNOSIS — Z6841 Body Mass Index (BMI) 40.0 and over, adult: Secondary | ICD-10-CM

## 2024-07-04 DIAGNOSIS — Z7985 Long-term (current) use of injectable non-insulin antidiabetic drugs: Secondary | ICD-10-CM

## 2024-07-04 LAB — GLUCOSE, CAPILLARY: Glucose-Capillary: 119 mg/dL — ABNORMAL HIGH (ref 70–99)

## 2024-07-04 LAB — POCT GLYCOSYLATED HEMOGLOBIN (HGB A1C): Hemoglobin A1C: 6.3 % — AB (ref 4.0–5.6)

## 2024-07-04 MED ORDER — DICLOFENAC SODIUM 1 % EX GEL
4.0000 g | Freq: Two times a day (BID) | CUTANEOUS | 1 refills | Status: AC
  Filled 2024-07-04 – 2024-07-05 (×2): qty 100, 13d supply, fill #0

## 2024-07-04 MED ORDER — LOSARTAN POTASSIUM 50 MG PO TABS
50.0000 mg | ORAL_TABLET | Freq: Every day | ORAL | 3 refills | Status: AC
  Filled 2024-07-04 – 2024-07-24 (×2): qty 90, 90d supply, fill #0
  Filled 2024-10-29: qty 90, 90d supply, fill #1

## 2024-07-04 MED ORDER — MOUNJARO 7.5 MG/0.5ML ~~LOC~~ SOAJ
7.5000 mg | SUBCUTANEOUS | 1 refills | Status: DC
  Filled 2024-07-04 – 2024-07-24 (×2): qty 2, 28d supply, fill #0
  Filled 2024-08-17 – 2024-08-24 (×2): qty 2, 28d supply, fill #1

## 2024-07-04 MED ORDER — ROSUVASTATIN CALCIUM 20 MG PO TABS
20.0000 mg | ORAL_TABLET | Freq: Every day | ORAL | 3 refills | Status: AC
  Filled 2024-07-04 – 2024-07-24 (×2): qty 90, 90d supply, fill #0
  Filled 2024-10-29: qty 90, 90d supply, fill #1

## 2024-07-04 MED ORDER — LINACLOTIDE 290 MCG PO CAPS
290.0000 ug | ORAL_CAPSULE | Freq: Every day | ORAL | 3 refills | Status: DC
  Filled 2024-07-04 – 2024-07-05 (×2): qty 90, 90d supply, fill #0

## 2024-07-04 MED ORDER — HYDROCHLOROTHIAZIDE 25 MG PO TABS
25.0000 mg | ORAL_TABLET | Freq: Every day | ORAL | 3 refills | Status: DC
  Filled 2024-07-04 – 2024-07-05 (×2): qty 90, 90d supply, fill #0

## 2024-07-04 NOTE — Assessment & Plan Note (Signed)
 A1c today is 6.3 (5.9 in 03/2024).  Managed with Tirzepatide  5 mg weekly.  She's tolerating Tirzepatide  well so I will increase to 7.5 today. Further counseled on the importance of diet and exercise. Check BMP. Ophthalmology referral.

## 2024-07-04 NOTE — Progress Notes (Unsigned)
 CC: Follow-up  HPI:  [name] is a 53 y.o. female living with a history stated below and presents today for follow-up. Please see problem based assessment and plan for additional details.  Past Medical History:  Diagnosis Date   HYPERGLYCEMIA 11/18/2010   Qualifier: Diagnosis of   By: Josetta Corrigan         Hypertension    Migraine    Obesity    Sleep apnea    has a c-pap    Current Outpatient Medications on File Prior to Visit  Medication Sig Dispense Refill   albuterol (VENTOLIN HFA) 108 (90 Base) MCG/ACT inhaler TAKE 2 PUFFS BY MOUTH EVERY 4 HOURS AS NEEDED     Blood Glucose Monitoring Suppl (BLOOD GLUCOSE MONITOR SYSTEM) w/Device KIT Use as directed to test blood sugar 3 times a day (morning, noon, and bedtime). 1 kit 0   fluticasone  (FLONASE ) 50 MCG/ACT nasal spray Place 2 sprays into both nostrils daily. 16 g 2   Glucose Blood (BLOOD GLUCOSE TEST STRIPS) STRP Use to check blood sugar daily as needed for symptoms of hypoglycemia. 100 strip 0   hydrochlorothiazide  (HYDRODIURIL ) 25 MG tablet Take 1 tablet (25 mg total) by mouth daily. 90 tablet 3   Insulin  Pen Needle 32G X 4 MM MISC use once a week 100 each 0   levocetirizine (XYZAL ) 5 MG tablet Take 1 tablet (5 mg total) by mouth every evening. 30 tablet 11   losartan  (COZAAR ) 50 MG tablet Take 1 tablet (50 mg total) by mouth daily. 30 tablet 11   meloxicam  (MOBIC ) 15 MG tablet Take 1 tablet daily with food for 7 days and then daily as needed thereafter. 15 tablet 0   meloxicam  (MOBIC ) 15 MG tablet Take 1 tablet (15 mg total) by mouth daily as needed for moderate pain (4-6). 30 tablet 0   rizatriptan  (MAXALT ) 10 MG tablet Take 1 tablet (10 mg total) by mouth once as needed for migraine. May repeat in 2 hours if needed 15 tablet 2   rosuvastatin  (CRESTOR ) 20 MG tablet Take 1 tablet (20 mg total) by mouth daily. 30 tablet 11   tirzepatide  (MOUNJARO ) 5 MG/0.5ML Pen Inject 5 mg into the skin once a week. 2 mL 1    valACYclovir  (VALTREX ) 500 MG tablet Take 1 tablet (500 mg total) by mouth daily. 90 tablet 3   No current facility-administered medications on file prior to visit.    Family History  Problem Relation Age of Onset   Breast cancer Mother    Migraines Daughter    Migraines Daughter    Breast cancer Maternal Uncle    Cancer Maternal Uncle        Breast cancer   Breast cancer Maternal Grandmother    Cancer Maternal Grandmother        Breast cancer   Kidney disease Brother        dialysis passed away 33 yrs   Migraines Son    Colon cancer Neg Hx    Colon polyps Neg Hx    Esophageal cancer Neg Hx     Social History   Socioeconomic History   Marital status: Divorced    Spouse name: Not on file   Number of children: 3   Years of education: BA   Highest education level: Not on file  Occupational History   Occupation: [employer]   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[employer]    Comment: and Chiropodist  Employer: [employer]  Tobacco Use   Smoking status: Never   Smokeless tobacco: Never  Vaping Use   Vaping status: Never Used  Substance and Sexual Activity   Alcohol use: Not Currently    Comment: wine occassionally   Drug use: No   Sexual activity: Not on file  Other Topics Concern   Not on file  Social History Narrative   Lives at home w/ her dog   Right-handed   Caffeine: rare   Social Drivers of Corporate investment banker Strain: Not on file  Food Insecurity: Not on file  Transportation Needs: Not on file  Physical Activity: Not on file  Stress: Not on file  Social Connections: Not on file  Intimate Partner Violence: Not on file    Review of Systems: ROS negative except for what is noted on the assessment and plan.  Vitals:   07/04/24 1446 07/04/24 1452  BP: 93/72 121/89  Pulse: (!) 102 (!) 102  Temp: 98.2 F (36.8 C)   TempSrc: Oral   SpO2: 95%   Weight: 264 lb 6.4 oz (119.9 kg)     Physical Exam: Constitutional:  well-appearing, sitting in chair, in no acute distress Cardiovascular: regular rate and rhythm, no m/r/g Pulmonary/Chest: normal work of breathing on room air, lungs clear to auscultation bilaterally Abdominal: soft, non-tender, non-distended MSK: normal bulk and tone Skin: warm and dry Psych: normal mood and behavior  Assessment & Plan:     Patient discussed with Dr. Karna  Essential hypertension Good control today. 116/79. No side effects of high or low pressures. Continue current therapy: - Hydrochlorothiazide  25 daily - losartan  50 daily   Diabetes mellitus, type II (HCC) Unable to tolerate dulaglutide  and semaglutide  due to GI upset. Will try tirzepatide . A1c under good control now having been on semaglutide  for several months. - tirzepatide  2.5mg  daily - ACR   Hyperlipidemia associated with type 2 diabetes mellitus (HCC) Primary prevention of CV disease.  - Continus rosuvastatin  20 daily - Lipid panel today   Class 3 severe obesity with serious comorbidity and body mass index (BMI) of 45.0 to 49.9 in adult [facility]) She has worked hard at weight loss with limited success. She tracked calories both before and after starting Trulicity /Ozempic  and reports intake average of 1200 calories daily. She has seen a dietician. She eats small portions and exercises by walking multiple times weekly. Unfortunately weight remains unchanged. - tirzepatide  per above - Ref to weight loss clinic    Chronic constipation Ongoing issue for which she saw GI in the past. Unfortunately that workup stalled when she could not get approval for a procedure. Since then, she is taking linzess  and amitiza   No problem-specific Assessment & Plan notes found for this encounter.   Norman Lobstein, D.O. [facility] Internal Medicine, PGY-1 Phone: [phone] Date 07/04/2024 Time 3:13 PM

## 2024-07-05 ENCOUNTER — Other Ambulatory Visit: Payer: Self-pay | Admitting: Student

## 2024-07-05 ENCOUNTER — Other Ambulatory Visit (HOSPITAL_COMMUNITY): Payer: Self-pay

## 2024-07-05 ENCOUNTER — Ambulatory Visit: Payer: Self-pay | Admitting: Student

## 2024-07-05 ENCOUNTER — Other Ambulatory Visit: Payer: Self-pay

## 2024-07-05 DIAGNOSIS — G8929 Other chronic pain: Secondary | ICD-10-CM | POA: Insufficient documentation

## 2024-07-05 DIAGNOSIS — E876 Hypokalemia: Secondary | ICD-10-CM

## 2024-07-05 LAB — BASIC METABOLIC PANEL WITH GFR
BUN/Creatinine Ratio: 7 — ABNORMAL LOW (ref 9–23)
BUN: 7 mg/dL (ref 6–24)
CO2: 26 mmol/L (ref 20–29)
Calcium: 9.8 mg/dL (ref 8.7–10.2)
Chloride: 95 mmol/L — ABNORMAL LOW (ref 96–106)
Creatinine, Ser: 1 mg/dL (ref 0.57–1.00)
Glucose: 98 mg/dL (ref 70–99)
Potassium: 3.1 mmol/L — ABNORMAL LOW (ref 3.5–5.2)
Sodium: 138 mmol/L (ref 134–144)
eGFR: 68 mL/min/1.73 (ref >59)

## 2024-07-05 NOTE — Assessment & Plan Note (Signed)
 Increased Tirzepatide  per above. Extensive discussion today regarding the importance of diet and exercise which she is confident she will improve upon.

## 2024-07-05 NOTE — Assessment & Plan Note (Addendum)
 Normotensive. Continue Cozaar  50 mg daily and hydrochlorothiazide  25 mg daily.

## 2024-07-05 NOTE — Assessment & Plan Note (Signed)
 Chronic. R > L. I ordered Voltaren  gel today and counseled on how weight loss will improve knee pain.

## 2024-07-06 ENCOUNTER — Other Ambulatory Visit

## 2024-07-06 DIAGNOSIS — E876 Hypokalemia: Secondary | ICD-10-CM

## 2024-07-06 NOTE — Progress Notes (Signed)
 Internal Medicine Clinic Attending  Case discussed with the resident at the time of the visit.  We reviewed the resident's history and exam and pertinent patient test results.  I agree with the assessment, diagnosis, and plan of care documented in the resident's note.

## 2024-07-06 NOTE — Addendum Note (Signed)
 Addended by: KARNA FELLOWS on: 07/06/2024 11:05 AM   Modules accepted: Level of Service

## 2024-07-07 ENCOUNTER — Ambulatory Visit: Payer: Self-pay | Admitting: Student

## 2024-07-07 ENCOUNTER — Other Ambulatory Visit: Payer: Self-pay | Admitting: Student

## 2024-07-07 DIAGNOSIS — E876 Hypokalemia: Secondary | ICD-10-CM

## 2024-07-07 LAB — BASIC METABOLIC PANEL WITH GFR
BUN/Creatinine Ratio: 7 — ABNORMAL LOW (ref 9–23)
BUN: 7 mg/dL (ref 6–24)
CO2: 26 mmol/L (ref 20–29)
Calcium: 9.5 mg/dL (ref 8.7–10.2)
Chloride: 95 mmol/L — ABNORMAL LOW (ref 96–106)
Creatinine, Ser: 0.99 mg/dL (ref 0.57–1.00)
Glucose: 108 mg/dL — ABNORMAL HIGH (ref 70–99)
Potassium: 3.3 mmol/L — ABNORMAL LOW (ref 3.5–5.2)
Sodium: 140 mmol/L (ref 134–144)
eGFR: 69 mL/min/1.73 (ref >59)

## 2024-07-07 NOTE — Progress Notes (Signed)
 SABRA

## 2024-07-25 ENCOUNTER — Other Ambulatory Visit (HOSPITAL_COMMUNITY): Payer: Self-pay

## 2024-07-25 ENCOUNTER — Other Ambulatory Visit: Payer: Self-pay

## 2024-08-16 ENCOUNTER — Encounter: Payer: Self-pay | Admitting: Student

## 2024-08-17 ENCOUNTER — Telehealth: Payer: Self-pay

## 2024-08-17 ENCOUNTER — Other Ambulatory Visit (HOSPITAL_COMMUNITY): Payer: Self-pay

## 2024-08-17 NOTE — Telephone Encounter (Signed)
 Wanda [name] (Wanda [name]) Rx #: [id number] Mounjaro  7.5MG /0.5ML auto-injectors Form [employer] Electronic PA Form (2017 NCPDP) Created 3 hours ago Sent to Plan 2 hours ago Plan Response 27 minutes ago Submit Clinical Questions Determination Message from Plan Your PA has been resolved, no additional PA is required. For further inquiries please contact the number on the back of the member prescription card. (Message [phone])

## 2024-08-17 NOTE — Telephone Encounter (Signed)
 Prior Authorization for patient (Mounjaro  7.5MG /0.5ML auto-injectors) came through on cover my meds was submitted with last office notes and labs awaiting approval or denial.  KEY:[id number]

## 2024-08-18 ENCOUNTER — Other Ambulatory Visit: Payer: Self-pay | Admitting: Student

## 2024-08-19 ENCOUNTER — Other Ambulatory Visit (HOSPITAL_COMMUNITY): Payer: Self-pay

## 2024-08-19 ENCOUNTER — Other Ambulatory Visit: Payer: Self-pay | Admitting: Student

## 2024-08-19 DIAGNOSIS — K5909 Other constipation: Secondary | ICD-10-CM

## 2024-08-19 MED ORDER — LINACLOTIDE 290 MCG PO CAPS
290.0000 ug | ORAL_CAPSULE | Freq: Every day | ORAL | 0 refills | Status: AC
  Filled 2024-08-19: qty 30, 30d supply, fill #0

## 2024-08-24 ENCOUNTER — Other Ambulatory Visit (HOSPITAL_COMMUNITY): Payer: Self-pay

## 2024-09-05 ENCOUNTER — Other Ambulatory Visit (HOSPITAL_COMMUNITY): Payer: Self-pay

## 2024-09-05 MED ORDER — IBUPROFEN 800 MG PO TABS
800.0000 mg | ORAL_TABLET | Freq: Four times a day (QID) | ORAL | 0 refills | Status: AC | PRN
  Filled 2024-09-06 (×2): qty 30, 8d supply, fill #0

## 2024-09-06 ENCOUNTER — Other Ambulatory Visit (HOSPITAL_COMMUNITY): Payer: Self-pay

## 2024-09-06 ENCOUNTER — Encounter (HOSPITAL_COMMUNITY): Payer: Self-pay

## 2024-09-06 ENCOUNTER — Encounter: Payer: Self-pay | Admitting: Student

## 2024-09-06 ENCOUNTER — Other Ambulatory Visit: Payer: Self-pay

## 2024-09-08 ENCOUNTER — Other Ambulatory Visit: Payer: Self-pay | Admitting: Student

## 2024-09-08 ENCOUNTER — Telehealth: Payer: Self-pay

## 2024-09-08 ENCOUNTER — Other Ambulatory Visit (HOSPITAL_COMMUNITY): Payer: Self-pay

## 2024-09-08 MED ORDER — MOUNJARO 10 MG/0.5ML ~~LOC~~ SOAJ
10.0000 mg | SUBCUTANEOUS | 0 refills | Status: DC
  Filled 2024-09-08 – 2024-09-18 (×2): qty 6, 84d supply, fill #0

## 2024-09-08 NOTE — Telephone Encounter (Signed)
 Prior Authorization for patient (Mounjaro  10MG /0.5ML auto-injectors) came through on cover my meds was submitted awaiting approval or denial.  XZB:[id number]

## 2024-09-08 NOTE — Telephone Encounter (Signed)
 [facility] (Key: [id number]) Rx #: [id number] Mounjaro  10MG /0.5ML auto-injectors Form [employer] Electronic PA Form (2017 NCPDP) Created 2 hours ago Sent to Plan 38 minutes ago Plan Response 7 minutes ago Submit Clinical Questions Determination Message from Plan Your PA has been resolved, no additional PA is required. For further inquiries please contact the number on the back of the member prescription card. (Message 1005)  I called the pharmacy and spoke to [place] per Isaiah the patient picked up 7.5 Mg on 9/24. Patient will be able to pick up 10 Mg around 10/24. I lvm for patient regarding the following information.

## 2024-09-12 ENCOUNTER — Other Ambulatory Visit: Payer: Self-pay | Admitting: Student

## 2024-09-12 DIAGNOSIS — E876 Hypokalemia: Secondary | ICD-10-CM

## 2024-09-19 ENCOUNTER — Other Ambulatory Visit: Payer: Self-pay

## 2024-09-19 ENCOUNTER — Other Ambulatory Visit (HOSPITAL_COMMUNITY): Payer: Self-pay

## 2024-09-19 ENCOUNTER — Other Ambulatory Visit

## 2024-09-19 DIAGNOSIS — E876 Hypokalemia: Secondary | ICD-10-CM

## 2024-09-20 LAB — BASIC METABOLIC PANEL WITH GFR
BUN/Creatinine Ratio: 6 — ABNORMAL LOW (ref 9–23)
BUN: 6 mg/dL (ref 6–24)
CO2: 29 mmol/L (ref 20–29)
Calcium: 9.5 mg/dL (ref 8.7–10.2)
Chloride: 99 mmol/L (ref 96–106)
Creatinine, Ser: 1.08 mg/dL — ABNORMAL HIGH (ref 0.57–1.00)
Glucose: 86 mg/dL (ref 70–99)
Potassium: 3.4 mmol/L — ABNORMAL LOW (ref 3.5–5.2)
Sodium: 141 mmol/L (ref 134–144)
eGFR: 62 mL/min/1.73 (ref >59)

## 2024-09-21 ENCOUNTER — Ambulatory Visit: Payer: Self-pay | Admitting: Student

## 2024-10-18 ENCOUNTER — Other Ambulatory Visit (HOSPITAL_COMMUNITY): Payer: Self-pay

## 2024-10-18 MED ORDER — OMEPRAZOLE 20 MG PO CPDR
20.0000 mg | DELAYED_RELEASE_CAPSULE | Freq: Every morning | ORAL | 0 refills | Status: AC
  Filled 2024-10-18 – 2024-11-04 (×2): qty 30, 30d supply, fill #0

## 2024-10-28 ENCOUNTER — Other Ambulatory Visit (HOSPITAL_COMMUNITY): Payer: Self-pay

## 2024-10-30 ENCOUNTER — Other Ambulatory Visit (HOSPITAL_COMMUNITY): Payer: Self-pay

## 2024-11-01 ENCOUNTER — Other Ambulatory Visit (HOSPITAL_COMMUNITY): Payer: Self-pay

## 2024-11-01 ENCOUNTER — Ambulatory Visit

## 2024-11-01 ENCOUNTER — Other Ambulatory Visit: Payer: Self-pay

## 2024-11-01 ENCOUNTER — Encounter (HOSPITAL_COMMUNITY): Payer: Self-pay

## 2024-11-01 VITALS — BP 103/77 | HR 78 | Temp 97.7°F | Ht 63.0 in | Wt 243.0 lb

## 2024-11-01 DIAGNOSIS — I10 Essential (primary) hypertension: Secondary | ICD-10-CM

## 2024-11-01 DIAGNOSIS — E1169 Type 2 diabetes mellitus with other specified complication: Secondary | ICD-10-CM

## 2024-11-01 DIAGNOSIS — M79605 Pain in left leg: Secondary | ICD-10-CM

## 2024-11-01 MED ORDER — MOUNJARO 10 MG/0.5ML ~~LOC~~ SOAJ
12.5000 mg | SUBCUTANEOUS | 2 refills | Status: AC
  Filled 2024-11-01 – 2024-12-02 (×2): qty 2.5, 28d supply, fill #0

## 2024-11-01 MED ORDER — MELOXICAM 7.5 MG PO TABS
7.5000 mg | ORAL_TABLET | Freq: Every day | ORAL | 0 refills | Status: AC
  Filled 2024-11-01 (×2): qty 15, 15d supply, fill #0

## 2024-11-01 NOTE — Progress Notes (Signed)
 CC: left leg pain  HPI:  [name] is a 53 y.o. female living with a history stated below and presents today with left leg pain. Please see problem based assessment and plan for additional details.   Past Medical History:  Diagnosis Date   HYPERGLYCEMIA 11/18/2010   Qualifier: Diagnosis of   By: Josetta Corrigan         Hypertension    Migraine    Obesity    Sleep apnea    has a c-pap    Current Outpatient Medications on File Prior to Visit  Medication Sig Dispense Refill   albuterol (VENTOLIN HFA) 108 (90 Base) MCG/ACT inhaler TAKE 2 PUFFS BY MOUTH EVERY 4 HOURS AS NEEDED     Blood Glucose Monitoring Suppl (BLOOD GLUCOSE MONITOR SYSTEM) w/Device KIT Use as directed to test blood sugar 3 times a day (morning, noon, and bedtime). 1 kit 0   diclofenac  Sodium (VOLTAREN ) 1 % GEL Apply 4 grams topically 2 (two) times daily. 100 g 1   fluticasone  (FLONASE ) 50 MCG/ACT nasal spray Place 2 sprays into both nostrils daily. 16 g 2   hydrochlorothiazide  (HYDRODIURIL ) 25 MG tablet Take 1 tablet (25 mg total) by mouth daily. 90 tablet 3   ibuprofen  (ADVIL ) 800 MG tablet Take 1 tablet (800 mg total) by mouth every 6 (six) hours as needed for mild pain (1-3). 30 tablet 0   levocetirizine (XYZAL ) 5 MG tablet Take 1 tablet (5 mg total) by mouth every evening. 30 tablet 11   linaclotide  (LINZESS ) 290 MCG CAPS capsule Take 1 capsule (290 mcg total) by mouth daily. 30 capsule 0   losartan  (COZAAR ) 50 MG tablet Take 1 tablet (50 mg total) by mouth daily. 90 tablet 3   omeprazole  (PRILOSEC) 20 MG capsule Take 1 capsule (20 mg total) by mouth in the morning. 30 capsule 0   rizatriptan  (MAXALT ) 10 MG tablet Take 1 tablet (10 mg total) by mouth once as needed for migraine. May repeat in 2 hours if needed 15 tablet 2   rosuvastatin  (CRESTOR ) 20 MG tablet Take 1 tablet (20 mg total) by mouth daily. 90 tablet 3   tirzepatide  (MOUNJARO ) 10 MG/0.5ML Pen Inject 10 mg into the skin once a week. 6 mL 0    valACYclovir  (VALTREX ) 500 MG tablet Take 1 tablet (500 mg total) by mouth daily. 90 tablet 3   No current facility-administered medications on file prior to visit.    Family History  Problem Relation Age of Onset   Breast cancer Mother    Migraines Daughter    Migraines Daughter    Breast cancer Maternal Uncle    Cancer Maternal Uncle        Breast cancer   Breast cancer Maternal Grandmother    Cancer Maternal Grandmother        Breast cancer   Kidney disease Brother        dialysis passed away 44 yrs   Migraines Son    Colon cancer Neg Hx    Colon polyps Neg Hx    Esophageal cancer Neg Hx     Social History   Socioeconomic History   Marital status: Divorced    Spouse name: Not on file   Number of children: 3   Years of education: BA   Highest education level: Not on file  Occupational History   Occupation: [employer]   Occupation: Agricultural Engineer: [employer]    Comment: and geophysicist/field seismologist  Chemical Engineer: [employer]  Tobacco Use   Smoking status: Never   Smokeless tobacco: Never  Vaping Use   Vaping status: Never Used  Substance and Sexual Activity   Alcohol use: Not Currently    Comment: wine occassionally   Drug use: No   Sexual activity: Not on file  Other Topics Concern   Not on file  Social History Narrative   Lives at home w/ her dog   Right-handed   Caffeine: rare   Social Drivers of Corporate Investment Banker Strain: Medium Risk (07/05/2024)   Overall Financial Resource Strain (CARDIA)    Difficulty of Paying Living Expenses: Somewhat hard  Food Insecurity: No Food Insecurity (07/05/2024)   Hunger Vital Sign    Worried About Running Out of Food in the Last Year: Never true    Ran Out of Food in the Last Year: Never true  Transportation Needs: No Transportation Needs (07/05/2024)   PRAPARE - Administrator, Civil Service (Medical): No    Lack of Transportation (Non-Medical): No  Physical Activity: Insufficiently Active  (07/05/2024)   Exercise Vital Sign    Days of Exercise per Week: 1 day    Minutes of Exercise per Session: 20 min  Stress: No Stress Concern Present (07/05/2024)   [employer] of Occupational Health - Occupational Stress Questionnaire    Feeling of Stress: Not at all  Social Connections: Moderately Integrated (07/05/2024)   Social Connection and Isolation Panel    Frequency of Communication with Friends and Family: More than three times a week    Frequency of Social Gatherings with Friends and Family: Once a week    Attends Religious Services: 1 to 4 times per year    Active Member of [employer] or Organizations: Yes    Attends Engineer, Structural: More than 4 times per year    Marital Status: Divorced  Intimate Partner Violence: Not At Risk (07/05/2024)   Humiliation, Afraid, Rape, and Kick questionnaire    Fear of Current or Ex-Partner: No    Emotionally Abused: No    Physically Abused: No    Sexually Abused: No    Review of Systems: ROS  Per HPI, assessment, plan Vitals:   11/01/24 0900  BP: 103/77  Pulse: 78  Temp: 97.7 F (36.5 C)  TempSrc: Oral  SpO2: 99%  Weight: 243 lb (110.2 kg)  Height: 5' 3 (1.6 m)    Physical Exam: Physical Exam Cardiovascular:     Rate and Rhythm: Normal rate and regular rhythm.     Comments: +2 RP BL Pulmonary:     Effort: Pulmonary effort is normal.     Breath sounds: Normal breath sounds.  Musculoskeletal:     Comments: LLE: Good active and passive ROM in left knee.  Patient reports burning sensation around lower shin.  No swelling, warmth, erythema noted. RLE: Good active and passive ROM in right knee.  Patient is able to walk well  Reports tenderness in bilateral lower lumbar region  Neurological:     Mental Status: She is alert.      Assessment & Plan:     Patient seen with Dr. Jeanelle  Assessment & Plan Essential hypertension Patient was previously on Cozaar  50 mg and HCTZ any 5 mg daily.  She has had mild  hypokalemia in the past and reports she was asked by Dr. Marylu to take half of her HCTZ dose.  During her recent UC  visit patient was asked to stop taking HCTZ for low potassium levels.  Her blood pressure is controlled today without being on HCTZ.  Will stop HCTZ and asked her to continue with Cozaar  50 mg.  Will check potassium levels today. -Stop hydrochlorothiazide  - Continue Cozaar  50 mg daily -BMP today Type 2 diabetes mellitus with other specified complication, unspecified whether long term insulin  use (HCC) Last A1c 6.3.  Well-controlled with tirzepatide .  Patient tolerating the medication well.  Will increase tirzepatide  from 10 to 12.5 units. -Increase tirzepatide  to 12.5 units weekly -F/U in 1 month Left leg pain Patient reports left leg burning and numbness that has been going on for a month.  She reports burning with pins-and-needles in her lower left shin which has been constant.  Her numbness is present in anterolateral region of her left thigh and only occurs when she is laying down.  Patient reports not wearing tight clothing for her legs.  She also reports bilateral knee pain for which she has seen orthopedics 3 years ago was told in her reports it was due to arthritis.  Patient reports getting injection, physical therapy, meloxicam  then.  Her injection in one of the knees helped before the pain came back within a month.  Patient does not want to get another injection today.  Physical therapy did not help her however meloxicam  did in the past.  Voltaren  gel for her knees has not been helpful.  Patient does have chronic lower back pain which she believes is due to the position in which she is sleeps that it is unrelated to her left leg pain.  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  Suspicion for bony lesions or skin abnormalities is low.  Will treat with meloxicam  as needed along with Tylenol  for  additional symptom control.  Explained to patient that weight management can also help with improvement in knee pain.  - Meloxicam  7.5 mg daily as needed-not to exceed more than 2 weeks - Tylenol  as needed--not to be exceeded by more than 4000 mg daily  Severe obesity (BMI >= 40) (HCC) Patient's obesity has been managed with tirzepatide .  She has been able to tolerate increased doses.  Patient's BMI has improved from over 47.4 in the evening of this year to 43.05 today.  Will increase her tirzepatide  from 10 to 12.5 units today and follow-up with her in a month. -Increase tirzepatide  to 12.5 units weekly    No orders of the defined types were placed in this encounter.    Rebecka Pion, D.O. [facility], PGY-1 Date 11/01/2024 Time 9:18 AM

## 2024-11-01 NOTE — Patient Instructions (Addendum)
 Please follow instructions as discussed in today's plan: -Take Meloxicam  7.5 mg: one tablet as needed for left leg pain -Don't take meloxicam  continuously for more than 2 weeks -Take tylenol  along with meloxicam  if pain is uncontrolled. -Increase Tirzepatide  from 10 to 12.5 Units weekly.  -Follow-up in one month for Tirzepatide  dose-increase management. - Stop taking hydrochlorothiazide  25 mg daily - Continue Cozaar  50 mg daily - Will cal with your lab results   Thank You  Rebecka Pion

## 2024-11-01 NOTE — Assessment & Plan Note (Signed)
 Patient was previously on Cozaar  50 mg and HCTZ any 5 mg daily.  She has had mild hypokalemia in the past and reports she was asked by Dr. Marylu to take half of her HCTZ dose.  During her recent UC visit patient was asked to stop taking HCTZ for low potassium levels.  Her blood pressure is controlled today without being on HCTZ.  Will stop HCTZ and asked her to continue with Cozaar  50 mg.  Will check potassium levels today. -Stop hydrochlorothiazide  - Continue Cozaar  50 mg daily -BMP today

## 2024-11-01 NOTE — Assessment & Plan Note (Signed)
 Last A1c 6.3.  Well-controlled with tirzepatide .  Patient tolerating the medication well.  Will increase tirzepatide  from 10 to 12.5 units. -Increase tirzepatide  to 12.5 units weekly -F/U in 1 month

## 2024-11-02 ENCOUNTER — Other Ambulatory Visit: Payer: Self-pay

## 2024-11-02 ENCOUNTER — Ambulatory Visit: Payer: Self-pay

## 2024-11-02 LAB — BASIC METABOLIC PANEL WITH GFR
BUN/Creatinine Ratio: 6 — ABNORMAL LOW (ref 9–23)
BUN: 6 mg/dL (ref 6–24)
CO2: 27 mmol/L (ref 20–29)
Calcium: 9.5 mg/dL (ref 8.7–10.2)
Chloride: 101 mmol/L (ref 96–106)
Creatinine, Ser: 0.97 mg/dL (ref 0.57–1.00)
Glucose: 78 mg/dL (ref 70–99)
Potassium: 3.5 mmol/L (ref 3.5–5.2)
Sodium: 142 mmol/L (ref 134–144)
eGFR: 70 mL/min/1.73 (ref >59)

## 2024-11-02 NOTE — Telephone Encounter (Signed)
 Informed pt her K levels normalized. Asked her to discontinue hydrochlorothiazide  as discussed during her visit. Recommended eating K rich foods like bananas, beans, spinach. Informed her to continue taking cozaar  as that is known to increase K levels as well.

## 2024-11-03 ENCOUNTER — Encounter (HOSPITAL_COMMUNITY): Payer: Self-pay

## 2024-11-04 ENCOUNTER — Other Ambulatory Visit: Payer: Self-pay

## 2024-11-04 ENCOUNTER — Other Ambulatory Visit (HOSPITAL_COMMUNITY): Payer: Self-pay

## 2024-11-10 NOTE — Progress Notes (Signed)
 Internal Medicine Clinic Attending  I was physically present during the key portions of the resident provided service and participated in the medical decision making of patient's management care. I reviewed pertinent patient test results.  The assessment, diagnosis, and plan were formulated together and I agree with the documentation in the resident's note.  Jeanelle Layman CROME, MD

## 2024-11-22 ENCOUNTER — Other Ambulatory Visit

## 2024-12-02 ENCOUNTER — Other Ambulatory Visit: Payer: Self-pay

## 2024-12-05 ENCOUNTER — Other Ambulatory Visit: Payer: Self-pay

## 2024-12-06 ENCOUNTER — Encounter (HOSPITAL_COMMUNITY): Payer: Self-pay

## 2024-12-06 ENCOUNTER — Other Ambulatory Visit (HOSPITAL_COMMUNITY): Payer: Self-pay

## 2024-12-06 DIAGNOSIS — E1169 Type 2 diabetes mellitus with other specified complication: Secondary | ICD-10-CM

## 2024-12-06 MED ORDER — MOUNJARO 12.5 MG/0.5ML ~~LOC~~ SOAJ
12.5000 mg | SUBCUTANEOUS | 2 refills | Status: DC
  Filled 2024-12-06: qty 2.5, 28d supply, fill #0
  Filled 2024-12-12: qty 2, 28d supply, fill #0

## 2024-12-09 ENCOUNTER — Other Ambulatory Visit (HOSPITAL_COMMUNITY): Payer: Self-pay

## 2024-12-12 ENCOUNTER — Other Ambulatory Visit: Payer: Self-pay | Admitting: Student

## 2024-12-12 ENCOUNTER — Other Ambulatory Visit (HOSPITAL_COMMUNITY): Payer: Self-pay

## 2024-12-12 DIAGNOSIS — E1169 Type 2 diabetes mellitus with other specified complication: Secondary | ICD-10-CM

## 2024-12-12 MED ORDER — MOUNJARO 12.5 MG/0.5ML ~~LOC~~ SOAJ
12.5000 mg | SUBCUTANEOUS | 2 refills | Status: AC
  Filled 2024-12-12: qty 2, 28d supply, fill #0
  Filled 2024-12-31: qty 2, 28d supply, fill #1

## 2024-12-13 ENCOUNTER — Other Ambulatory Visit (HOSPITAL_COMMUNITY): Payer: Self-pay

## 2025-01-01 ENCOUNTER — Other Ambulatory Visit (HOSPITAL_COMMUNITY): Payer: Self-pay

## 2025-01-04 ENCOUNTER — Encounter (HOSPITAL_COMMUNITY): Payer: Self-pay

## 2025-01-06 ENCOUNTER — Other Ambulatory Visit (HOSPITAL_COMMUNITY): Payer: Self-pay

## 2025-01-06 ENCOUNTER — Encounter (HOSPITAL_COMMUNITY): Payer: Self-pay
